# Patient Record
Sex: Female | Born: 1970 | Race: White | Hispanic: No | Marital: Married | Smoking: Never smoker
Health system: Southern US, Community
[De-identification: ages and names within clinical notes are randomized; demographics above are authoritative.]

## PROBLEM LIST (undated history)

## (undated) DIAGNOSIS — E119 Type 2 diabetes mellitus without complications: Secondary | ICD-10-CM

## (undated) DIAGNOSIS — R0981 Nasal congestion: Secondary | ICD-10-CM

## (undated) DIAGNOSIS — E039 Hypothyroidism, unspecified: Secondary | ICD-10-CM

## (undated) DIAGNOSIS — I10 Essential (primary) hypertension: Secondary | ICD-10-CM

## (undated) DIAGNOSIS — G4730 Sleep apnea, unspecified: Secondary | ICD-10-CM

## (undated) DIAGNOSIS — E079 Disorder of thyroid, unspecified: Secondary | ICD-10-CM

## (undated) DIAGNOSIS — T884XXA Failed or difficult intubation, initial encounter: Secondary | ICD-10-CM

## (undated) HISTORY — DX: Disorder of thyroid, unspecified: E07.9

## (undated) HISTORY — DX: Nasal congestion: R09.81

## (undated) HISTORY — DX: Essential (primary) hypertension: I10

---

## 2005-09-24 ENCOUNTER — Emergency Department: Payer: Self-pay | Admitting: Emergency Medicine

## 2005-10-17 HISTORY — PX: PARTIAL HYSTERECTOMY: SHX80

## 2005-10-17 HISTORY — PX: ABDOMINAL HYSTERECTOMY: SHX81

## 2005-10-17 HISTORY — PX: TUBAL LIGATION: SHX77

## 2005-11-11 ENCOUNTER — Ambulatory Visit: Payer: Self-pay

## 2005-11-19 ENCOUNTER — Observation Stay: Payer: Self-pay | Admitting: Unknown Physician Specialty

## 2006-01-31 ENCOUNTER — Ambulatory Visit: Payer: Self-pay | Admitting: Unknown Physician Specialty

## 2006-10-17 HISTORY — PX: MOUTH SURGERY: SHX715

## 2006-10-27 ENCOUNTER — Other Ambulatory Visit: Payer: Self-pay

## 2006-10-27 ENCOUNTER — Emergency Department: Payer: Self-pay | Admitting: Emergency Medicine

## 2008-01-30 ENCOUNTER — Other Ambulatory Visit: Payer: Self-pay

## 2008-01-30 ENCOUNTER — Emergency Department: Payer: Self-pay | Admitting: Emergency Medicine

## 2008-05-19 ENCOUNTER — Ambulatory Visit: Payer: Self-pay | Admitting: Unknown Physician Specialty

## 2008-05-20 ENCOUNTER — Ambulatory Visit: Payer: Self-pay | Admitting: Unknown Physician Specialty

## 2008-07-09 ENCOUNTER — Ambulatory Visit: Payer: Self-pay | Admitting: Unknown Physician Specialty

## 2008-07-15 ENCOUNTER — Ambulatory Visit: Payer: Self-pay | Admitting: Unknown Physician Specialty

## 2010-06-11 ENCOUNTER — Emergency Department: Payer: Self-pay | Admitting: Emergency Medicine

## 2011-12-11 IMAGING — CR DG RIBS 2V*L*
1 series · 4 of 4 positions shown · non-contrast
Comparison: none

REASON FOR EXAM: pain after fall, and syncope
COMMENTS:   May transport without cardiac monitor

PROCEDURE:     DXR - DXR RIBS LEFT UNILATERAL  - June 11, 2010  [DATE]
RESULT:     Comparison: None

[Series 1: view not recorded · 0.17mm/px · 4 of 4 slices shown]
[im 1/4]
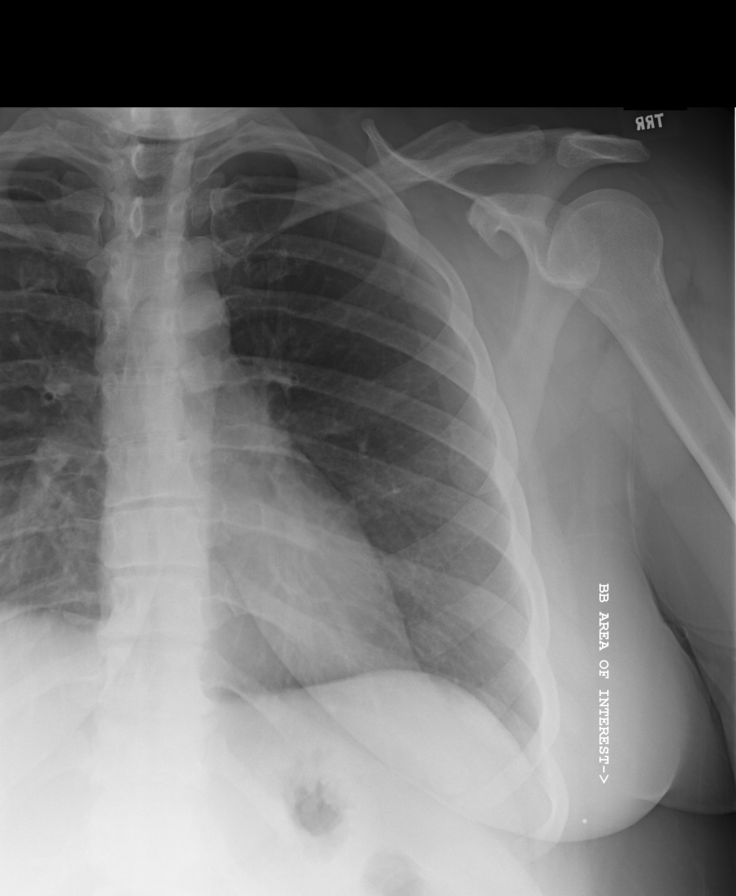
[im 2/4]
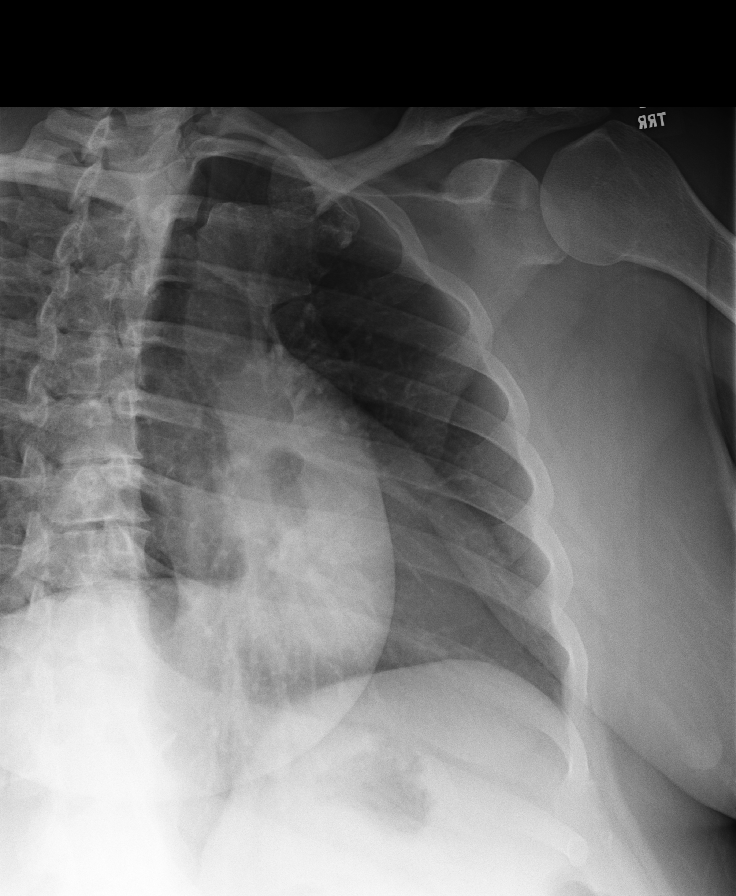
[im 3/4]
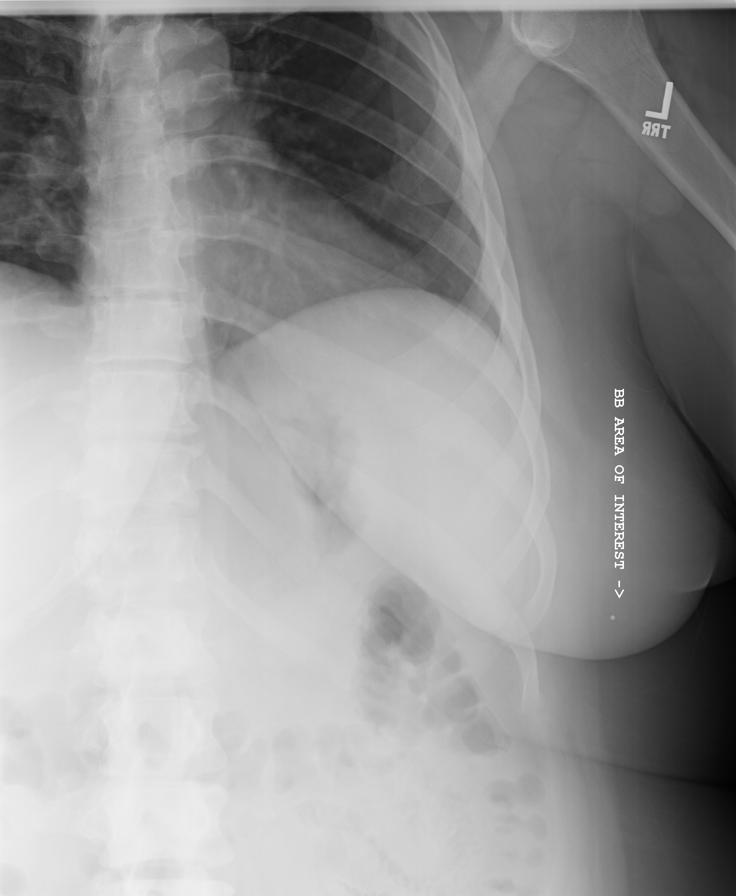
[im 4/4]
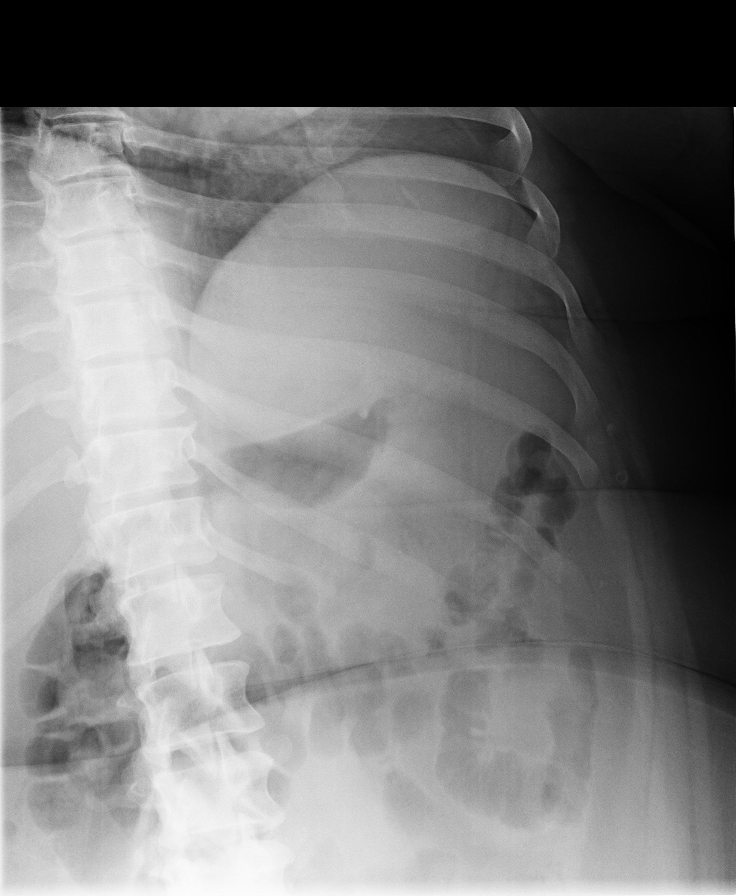

[4 of 4 positions shown; findings below may reference images not displayed]

FINDINGS: 4 views of the left ribs demonstrates no rib fracture or rib
deformity. There is no pleural reaction.
IMPRESSION: No acute osseous injury of the left ribs.

## 2011-12-11 IMAGING — CR DG KNEE COMPLETE 4+V*R*
1 series · 4 of 4 positions shown · non-contrast
Comparison: none

REASON FOR EXAM: pain
COMMENTS:   May transport without cardiac monitor

PROCEDURE:     DXR - DXR KNEE RT COMP WITH OBLIQUES  - June 11, 2010  [DATE]
RESULT:     Comparison:  None

[Series 1: view not recorded · 0.17mm/px · 4 of 4 slices shown]
[im 1/4]
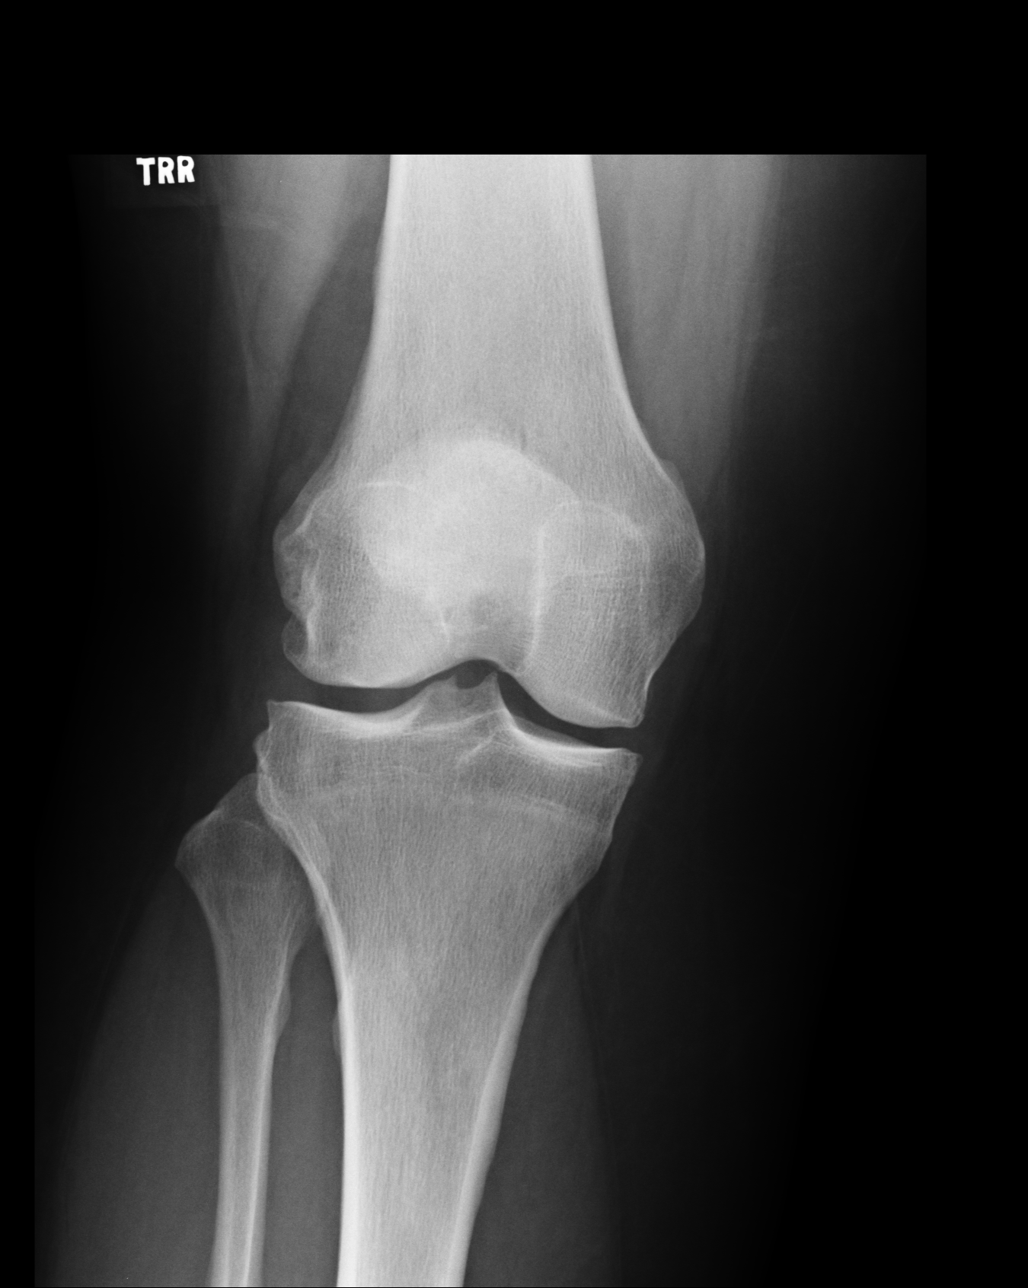
[im 2/4]
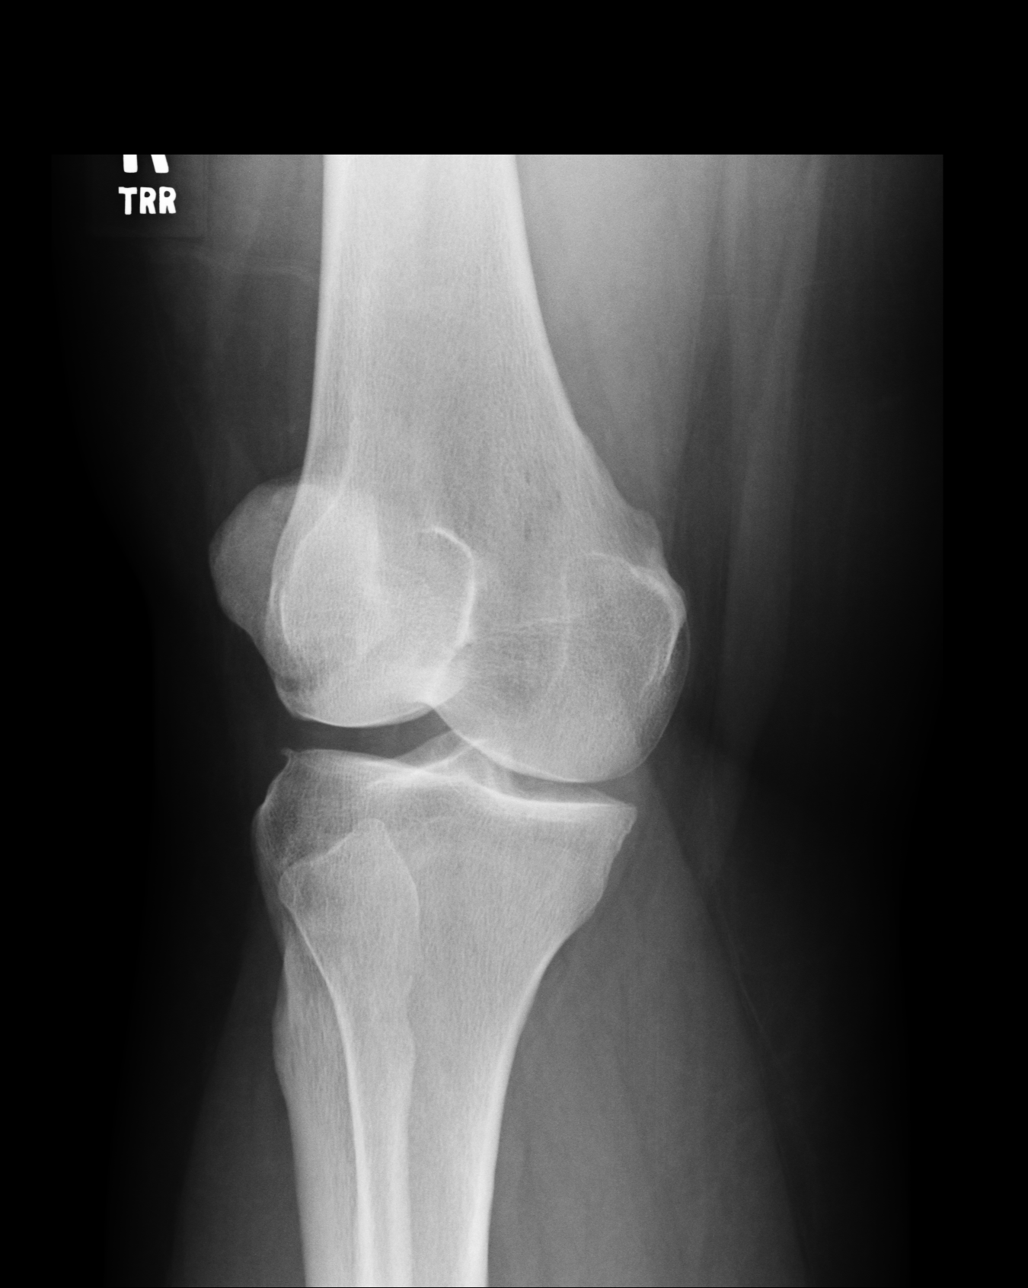
[im 3/4]
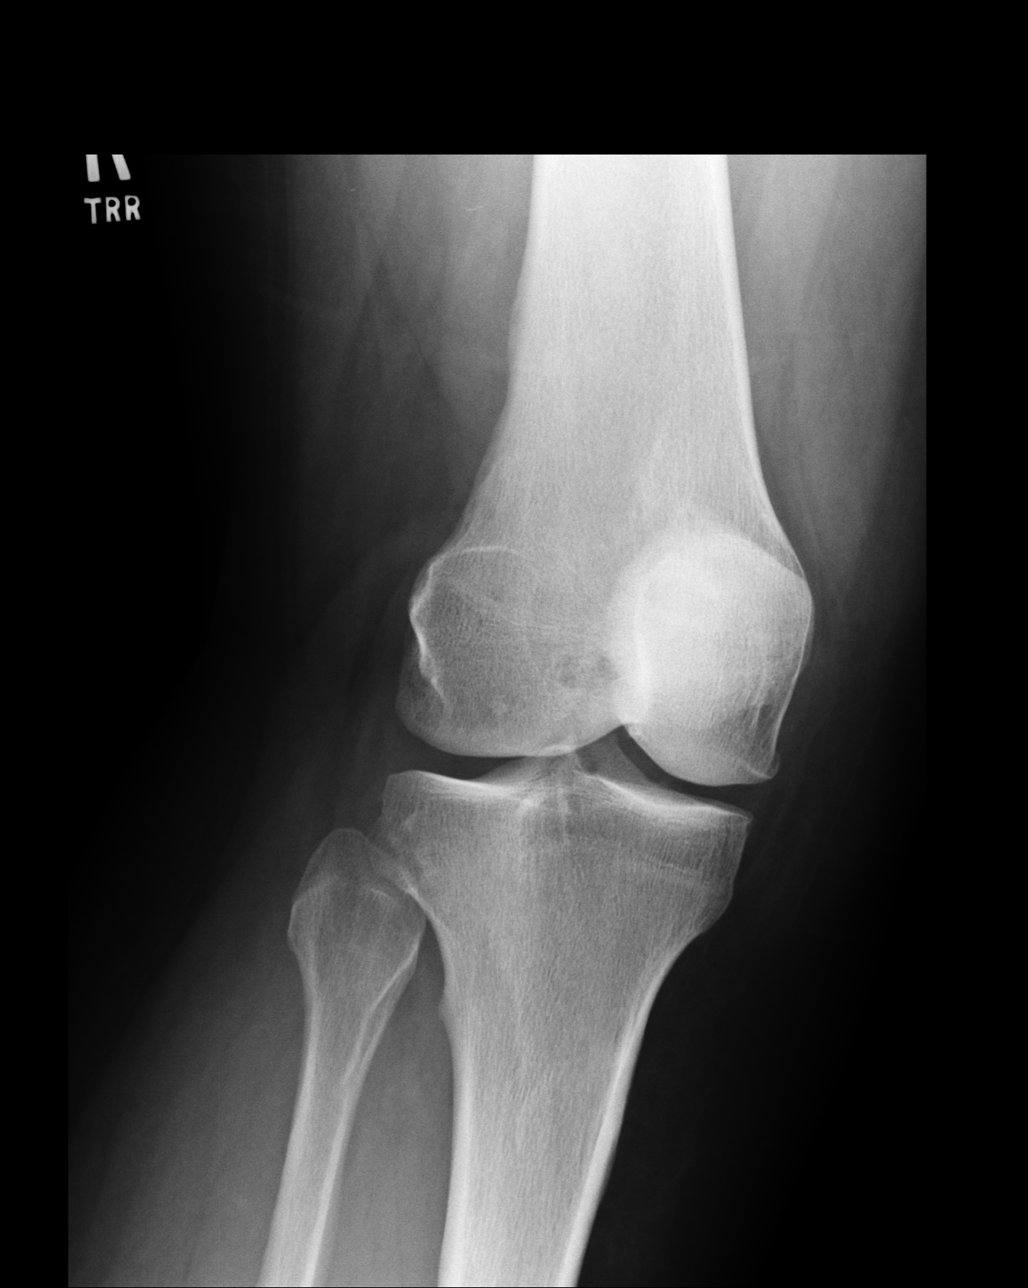
[im 4/4]
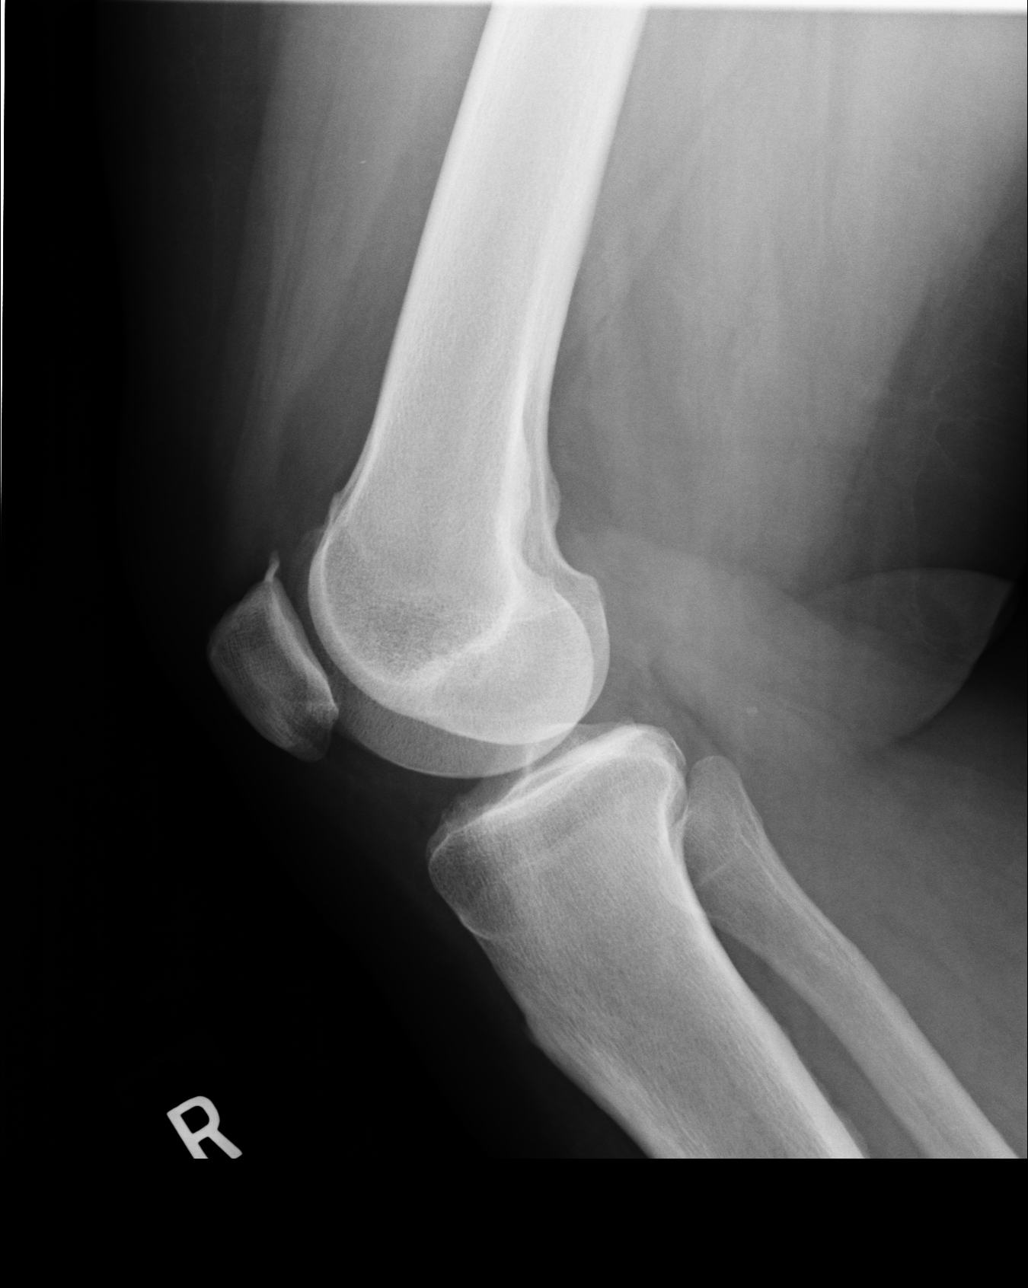

[4 of 4 positions shown; findings below may reference images not displayed]

FINDINGS: 4 views of the right knee demonstrates no acute fracture or dislocation.
There is no significant joint effusion. There is a patellofemoral marginal
osteophyte. The there is a lateral tibiofemoral compartment marginal
osteophyte.
IMPRESSION: No acute osseous injury of the right knee.

## 2011-12-11 IMAGING — CT CT HEAD WITHOUT CONTRAST
2 series · 16 of 30 positions shown, 20 images · non-contrast
Comparison: none

REASON FOR EXAM: hematoma to left forehead/eye, and syncope
COMMENTS:   May transport without cardiac monitor

PROCEDURE:     CT  - CT HEAD WITHOUT CONTRAST  - June 11, 2010  [DATE]
RESULT:
TECHNIQUE: Noncontrast emergent CT of the brain is performed in the standard
fashion.
There is no previous examination for comparison.

[Series 2: without · axial · non-contrast · 0.40mm/px · z∈[+150,+280]mm · 13 of 32 slices shown, 17 images]
[im 3/32  brain]
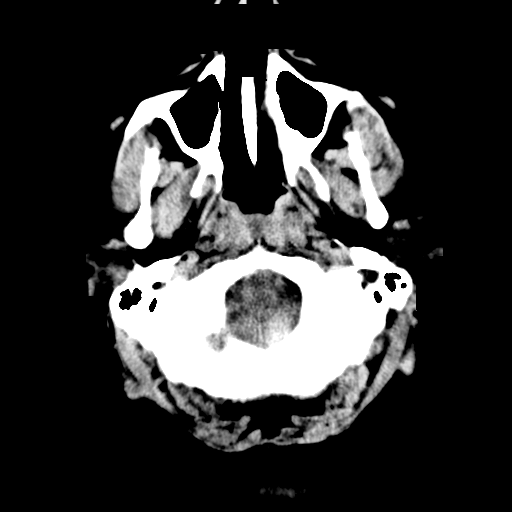
[im 3/32  bone]
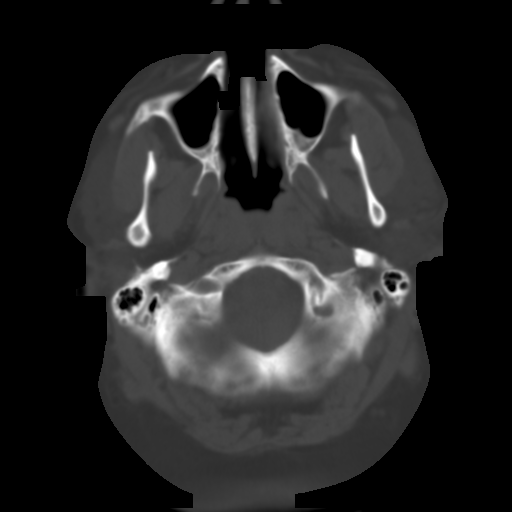
[im 5/32  brain]
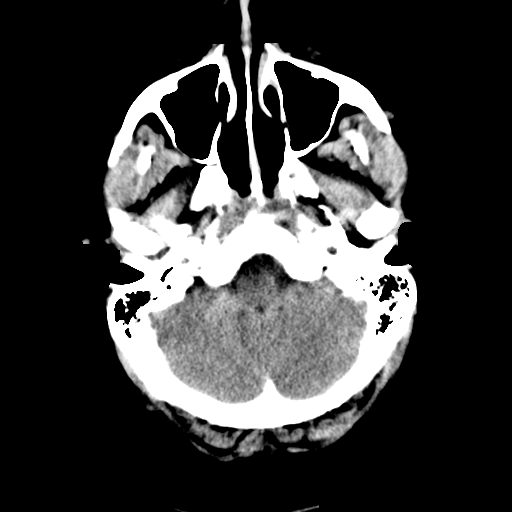
[im 7/32  brain]
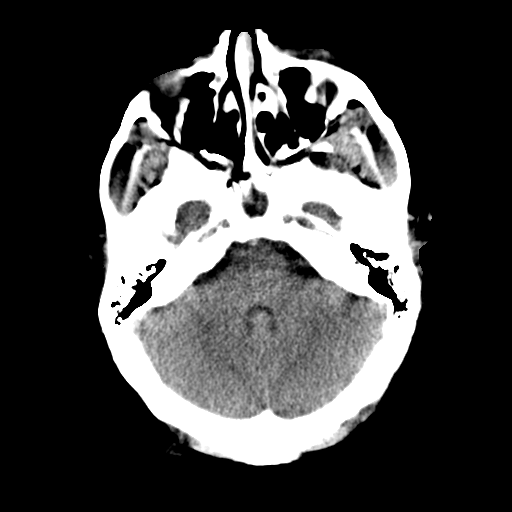
[im 9/32  brain]
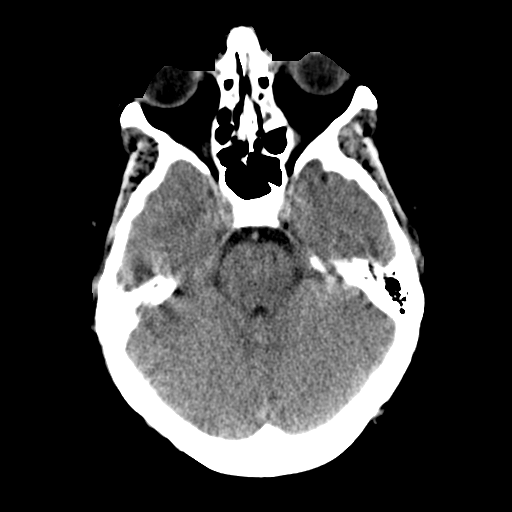
[im 12/32  brain]
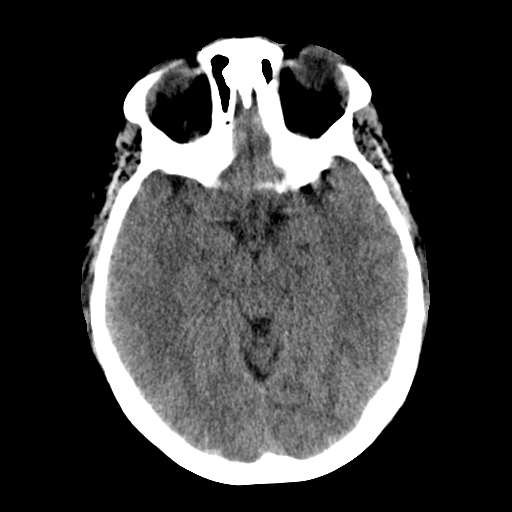
[im 12/32  bone]
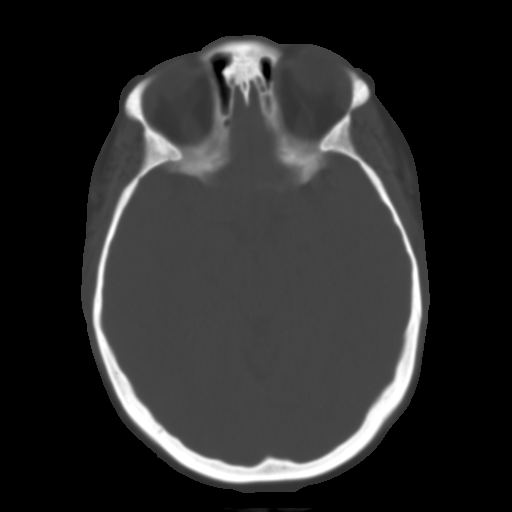
[im 14/32  brain]
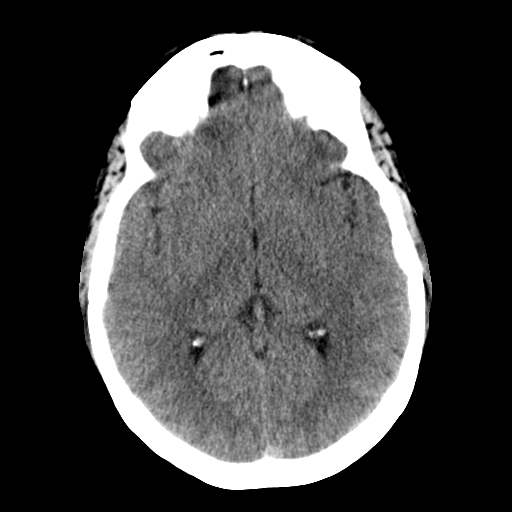
[im 16/32  brain]
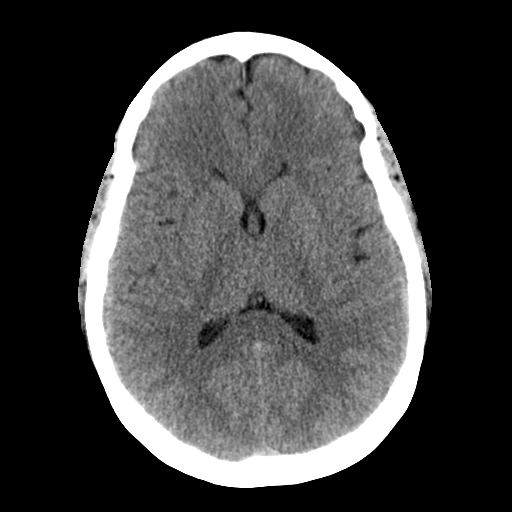
[im 18/32  brain]
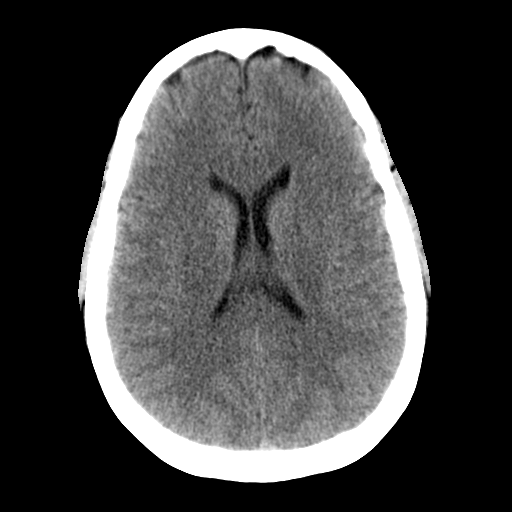
[im 20/32  brain]
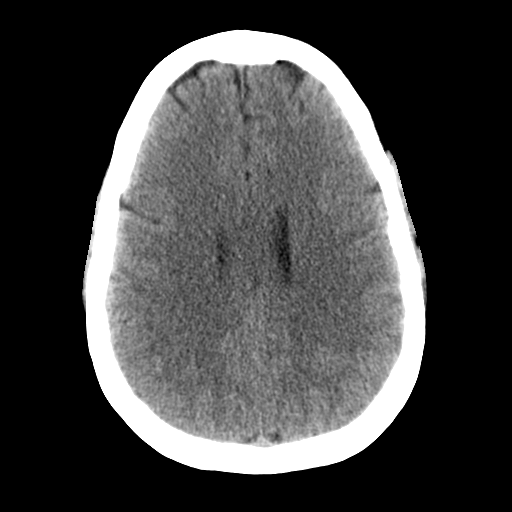
[im 20/32  bone]
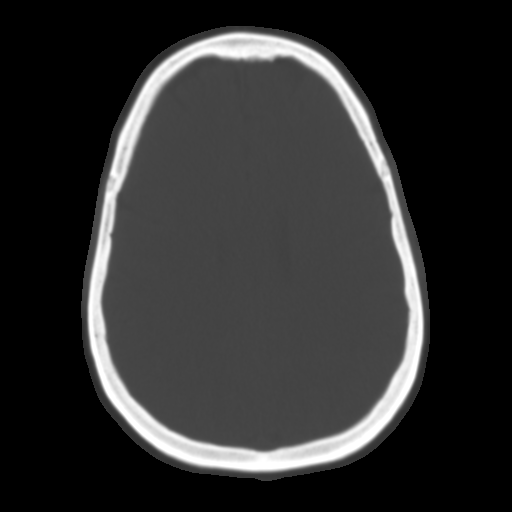
[im 23/32  brain]
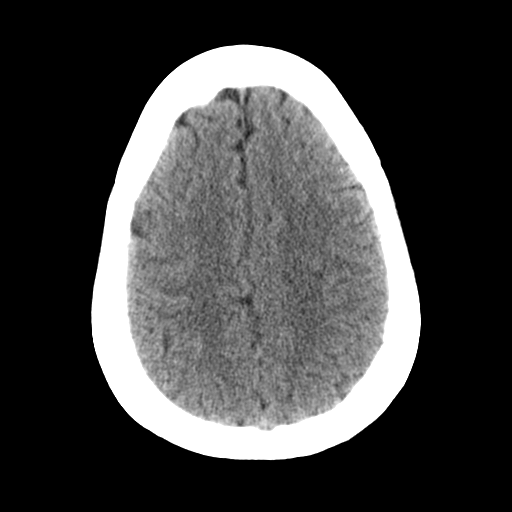
[im 25/32  brain]
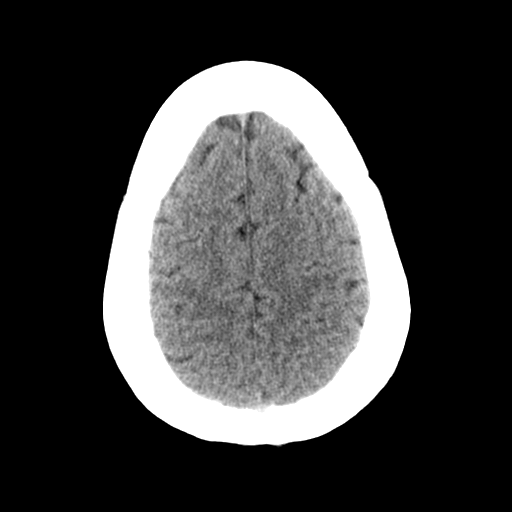
[im 27/32  brain]
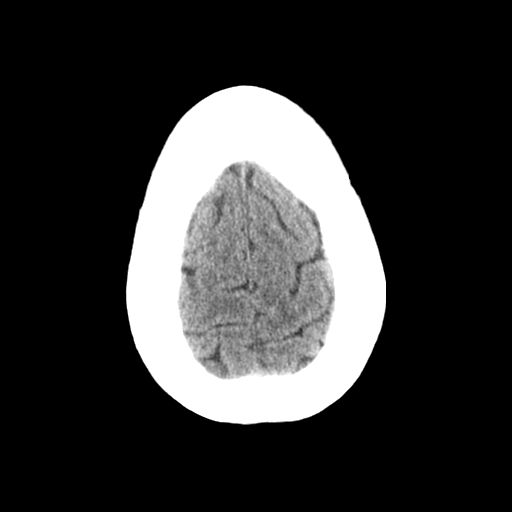
[im 29/32  brain]
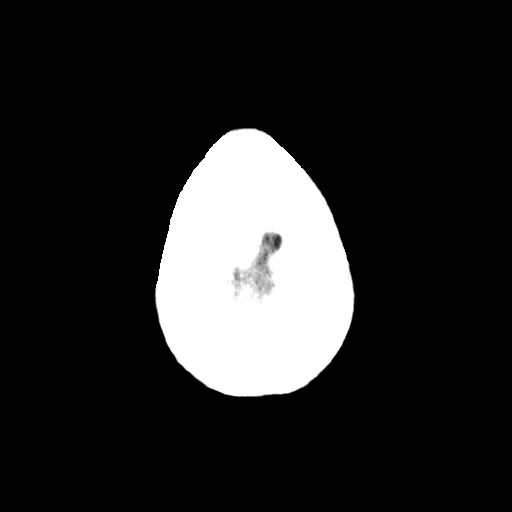
[im 29/32  bone]
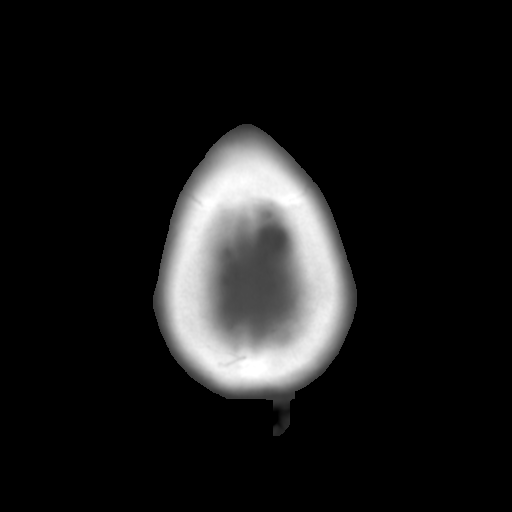

[Series 3: bone · axial · 0.40mm/px · z∈[+150,+196]mm · 3 of 32 slices shown]
[im 3/32  bone]
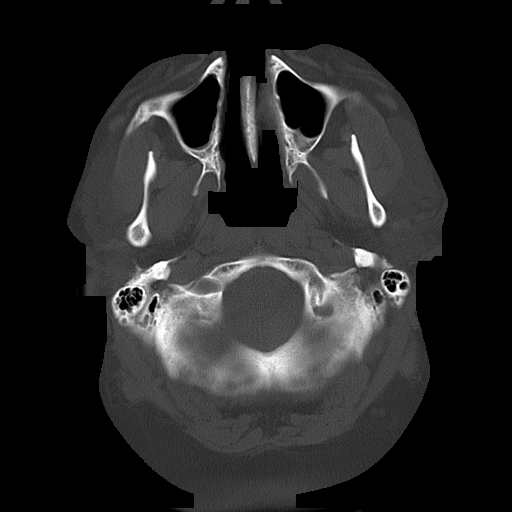
[im 7/32  bone]
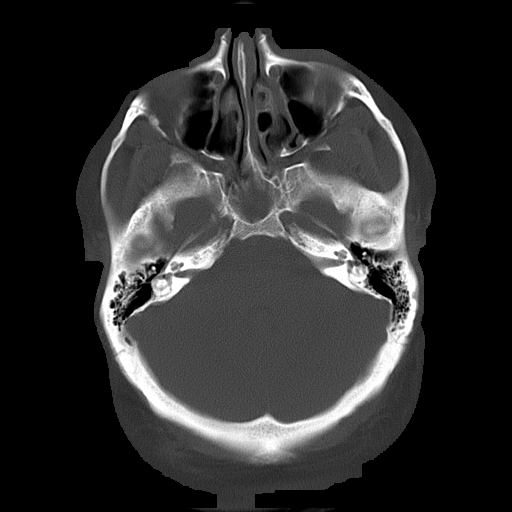
[im 12/32  bone]
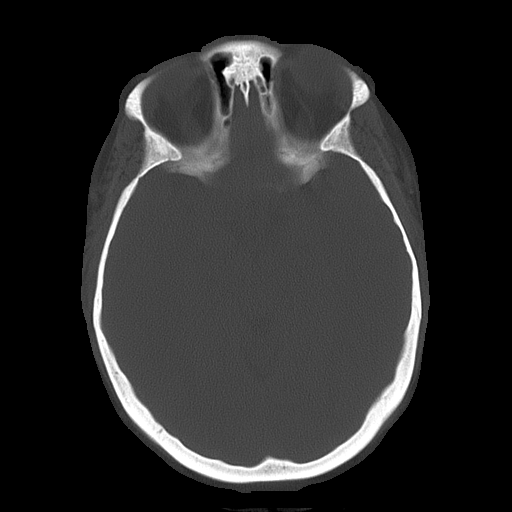

[16 of 30 positions shown; findings below may reference images not displayed]

FINDINGS: There is near complete opacification of the inferior portion of
the ethmoid sinuses. An air/fluid level is present within the sphenoid
sinus. The mastoids appear clear. The included maxillary sinuses and frontal
sinuses show no opacification. The calvarium is intact. The ventricles and
sulci appear to be within normal limits. There is no intracranial
hemorrhage, mass effect or midline shift. There is no territorial infarct.
IMPRESSION: 1.     Findings suggestive of ethmoid and sphenoid sinusitis.
2.     No acute intracranial abnormality evident.

## 2011-12-11 IMAGING — CR DG KNEE COMPLETE 4+V*L*
1 series · 4 of 4 positions shown · non-contrast
Comparison: none

REASON FOR EXAM: pain
COMMENTS:   May transport without cardiac monitor

PROCEDURE:     DXR - DXR KNEE LT COMP WITH OBLIQUES  - June 11, 2010  [DATE]
RESULT:     Comparison:  None

[Series 1: view not recorded · 0.17mm/px · 4 of 4 slices shown]
[im 1/4]
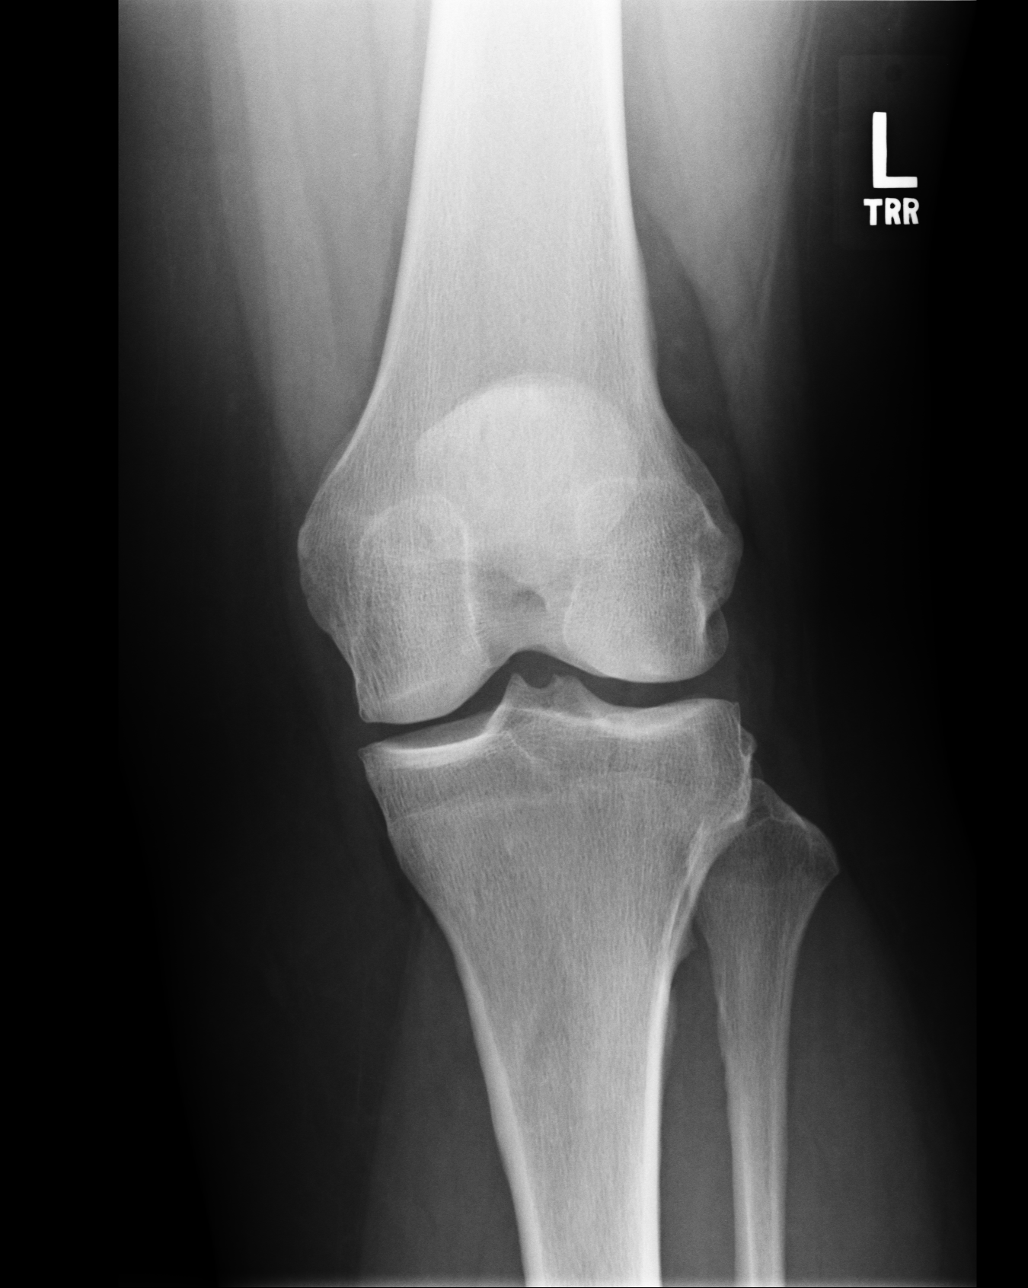
[im 2/4]
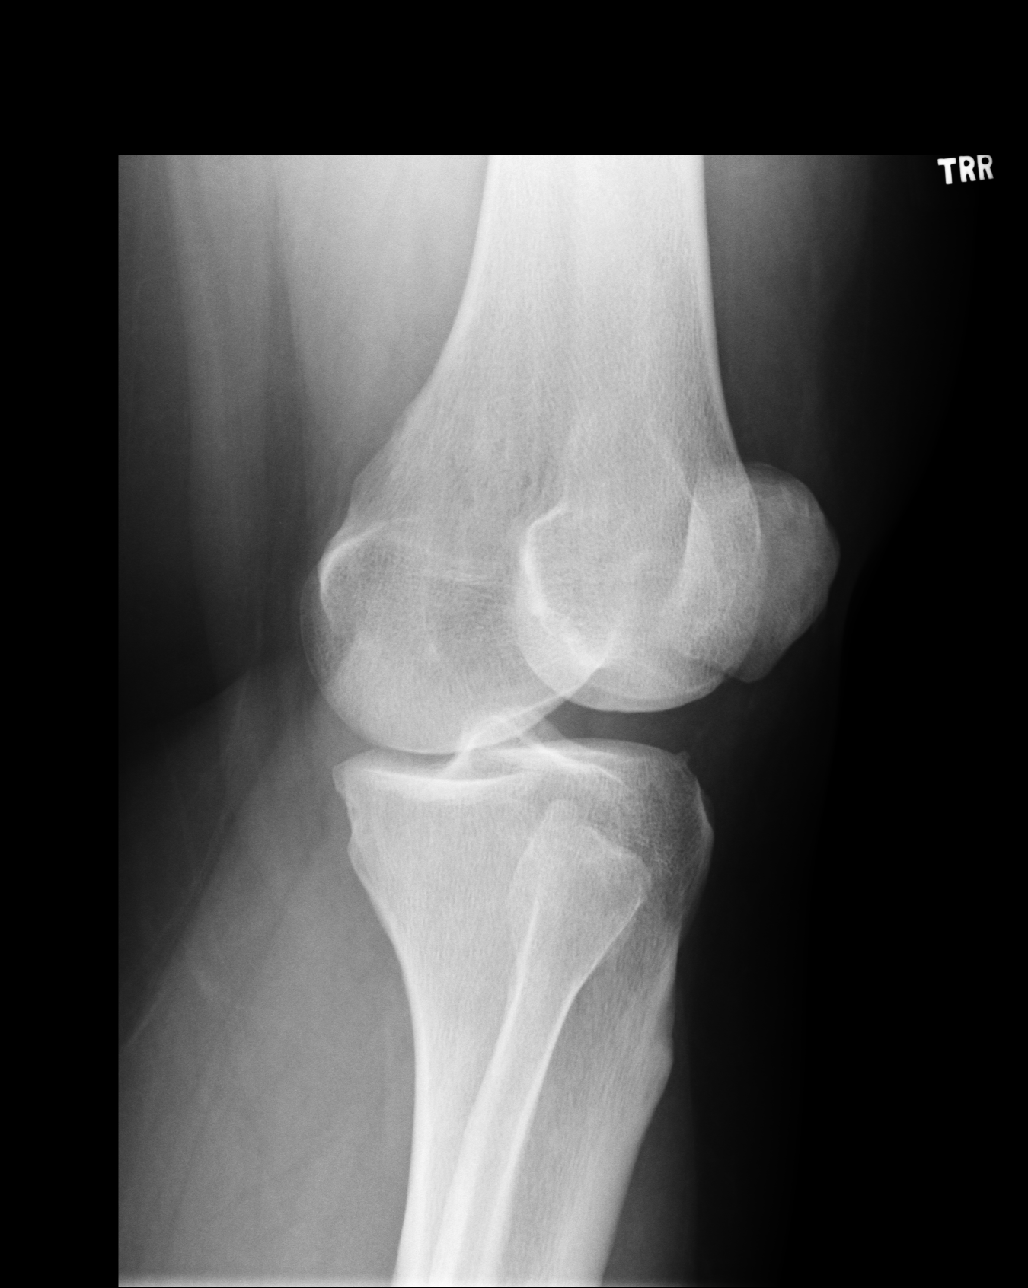
[im 3/4]
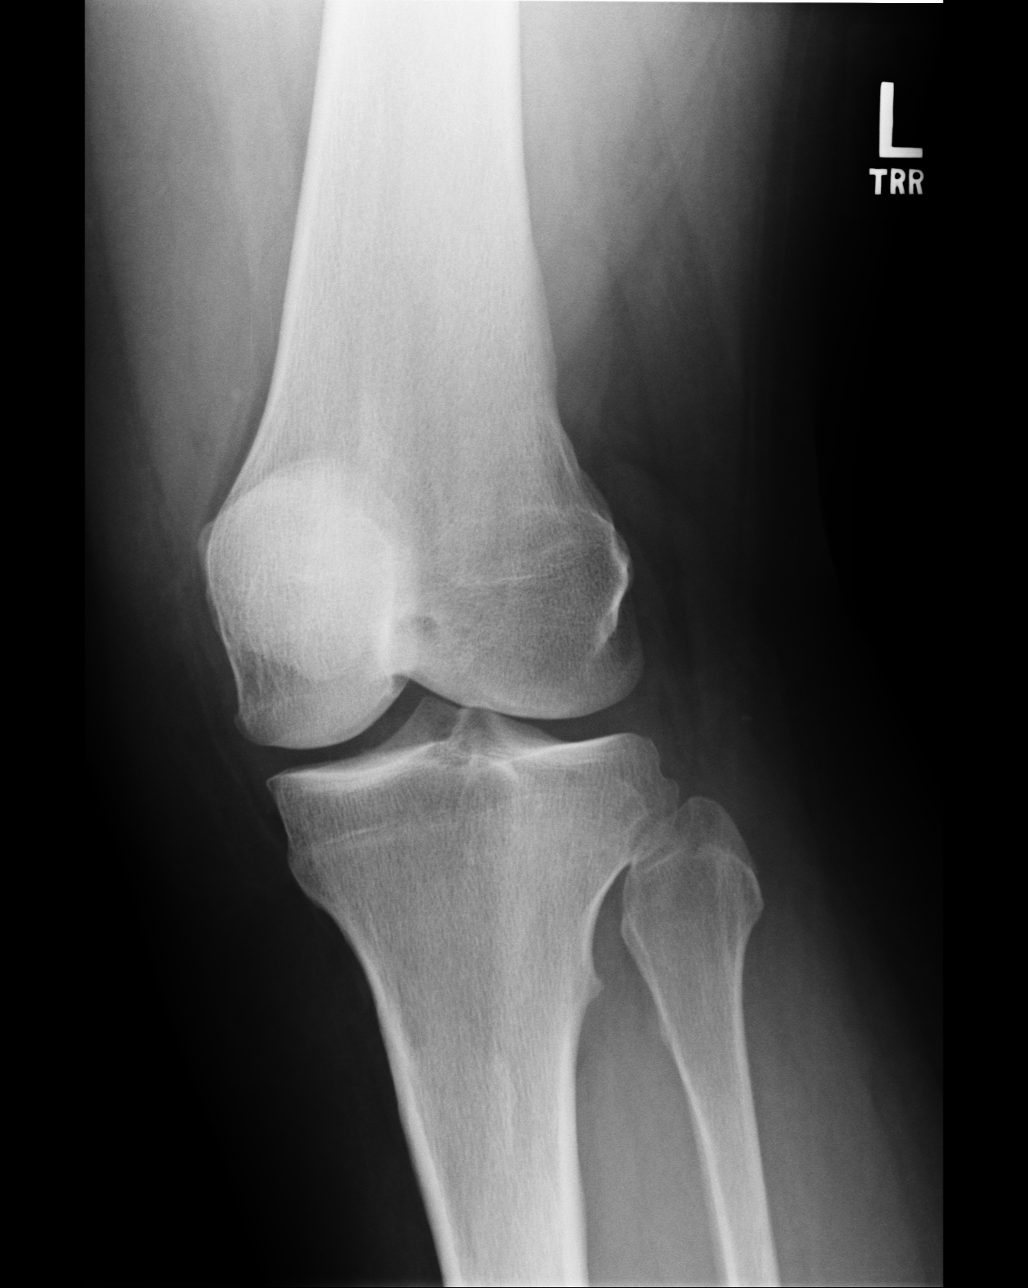
[im 4/4]
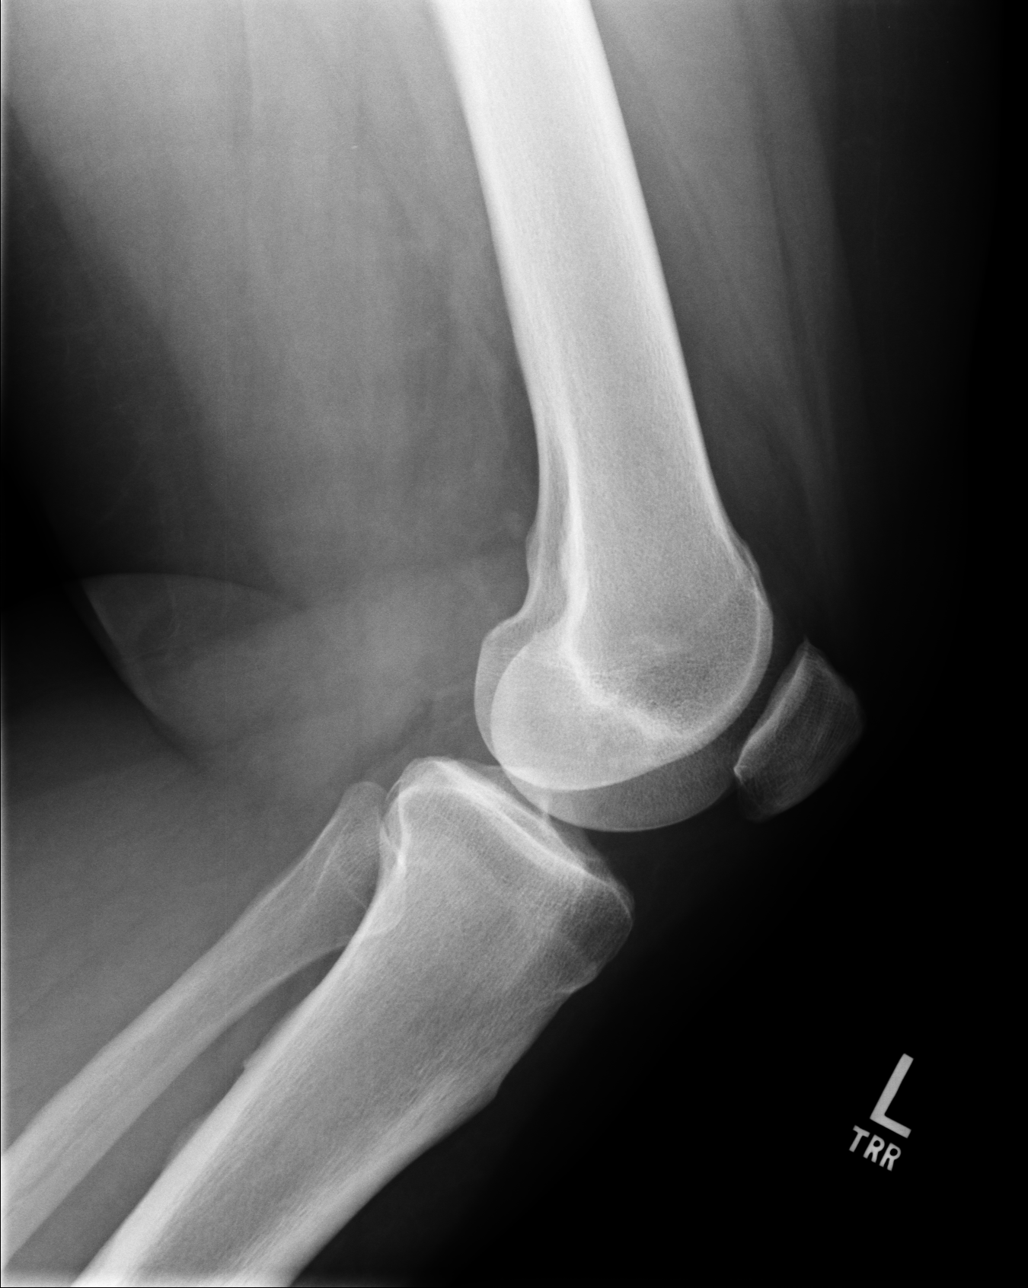

[4 of 4 positions shown; findings below may reference images not displayed]

FINDINGS: 4 views of the left knee demonstrates no acute fracture or dislocation.
There is no significant joint effusion.
IMPRESSION: No acute osseous injury of the left knee.

## 2011-12-11 IMAGING — CR CERVICAL SPINE - COMPLETE 4+ VIEW
1 series · 7 of 7 positions shown · non-contrast
Comparison: None

REASON FOR EXAM: fall
COMMENTS:   May transport without cardiac monitor

PROCEDURE:     DXR - DXR CERVICAL SPINE COMPLETE  - June 11, 2010  [DATE]
RESULT:     History: Fall

[Series 1: view not recorded · 0.17mm/px · 7 of 7 slices shown]
[im 1/7]
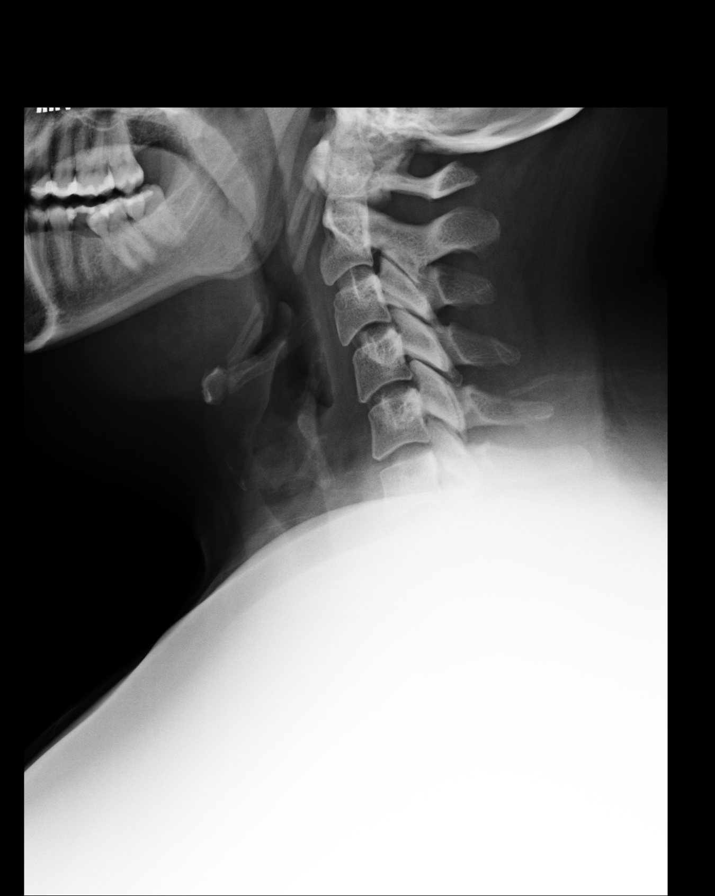
[im 2/7]
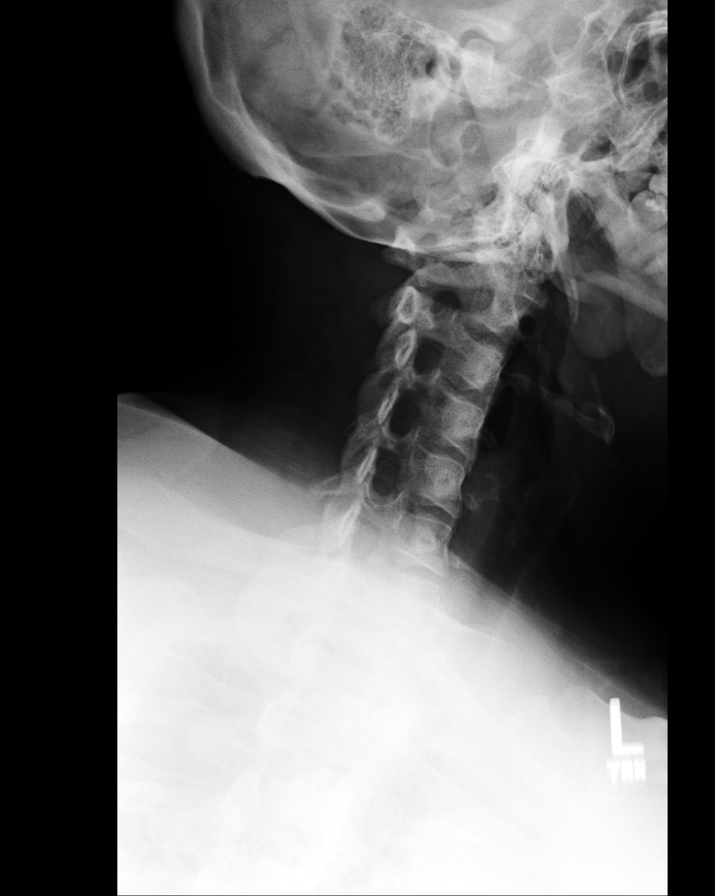
[im 3/7]
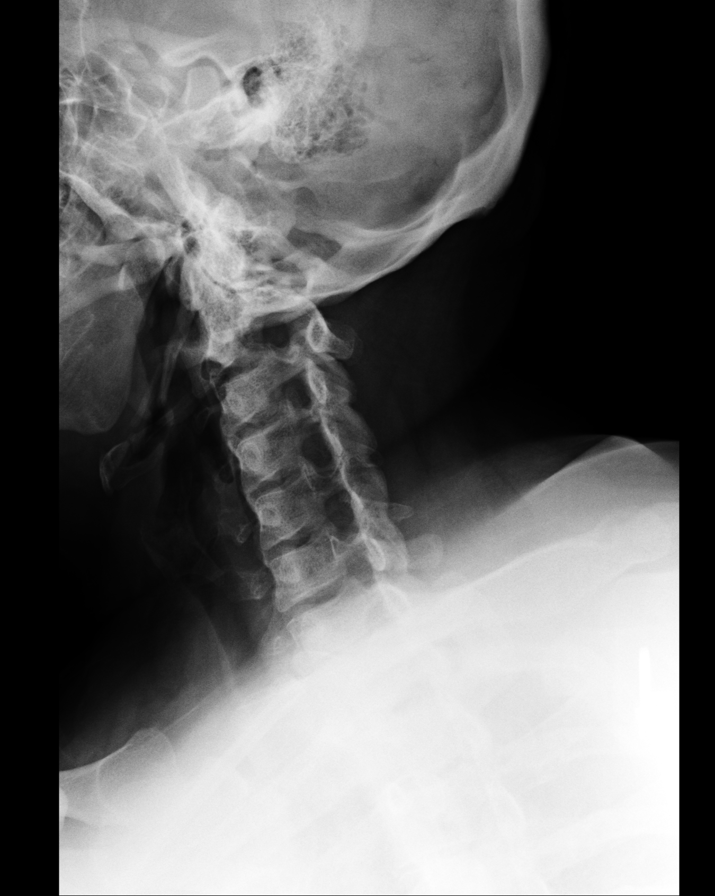
[im 4/7]
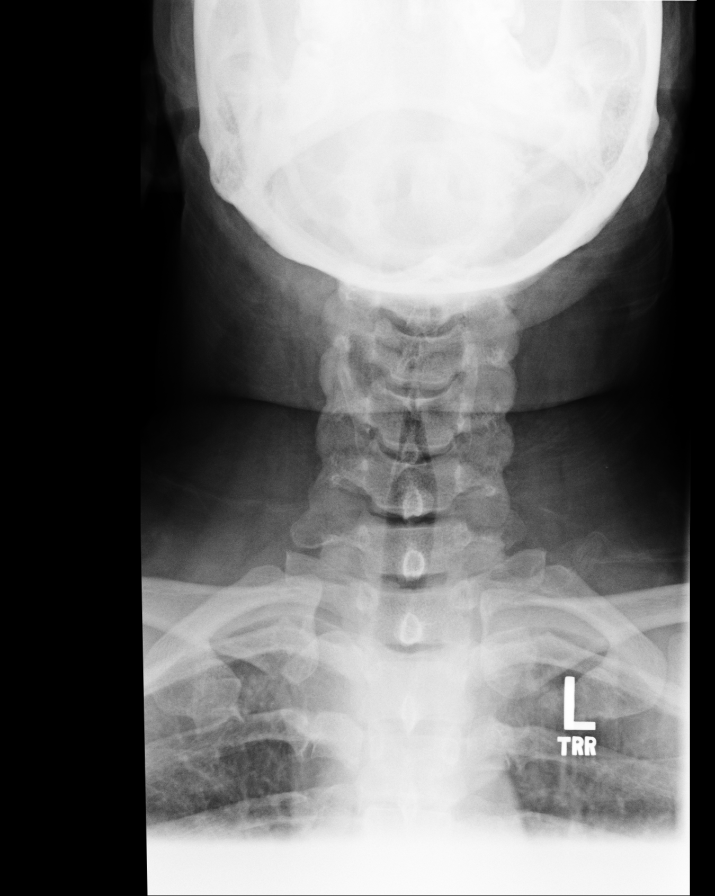
[im 5/7]
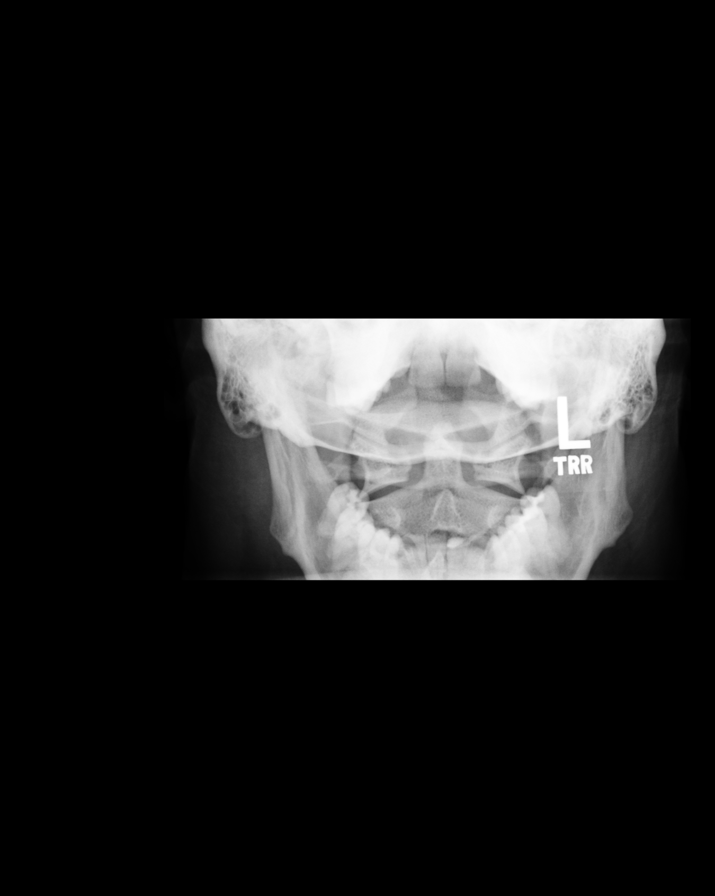
[im 6/7]
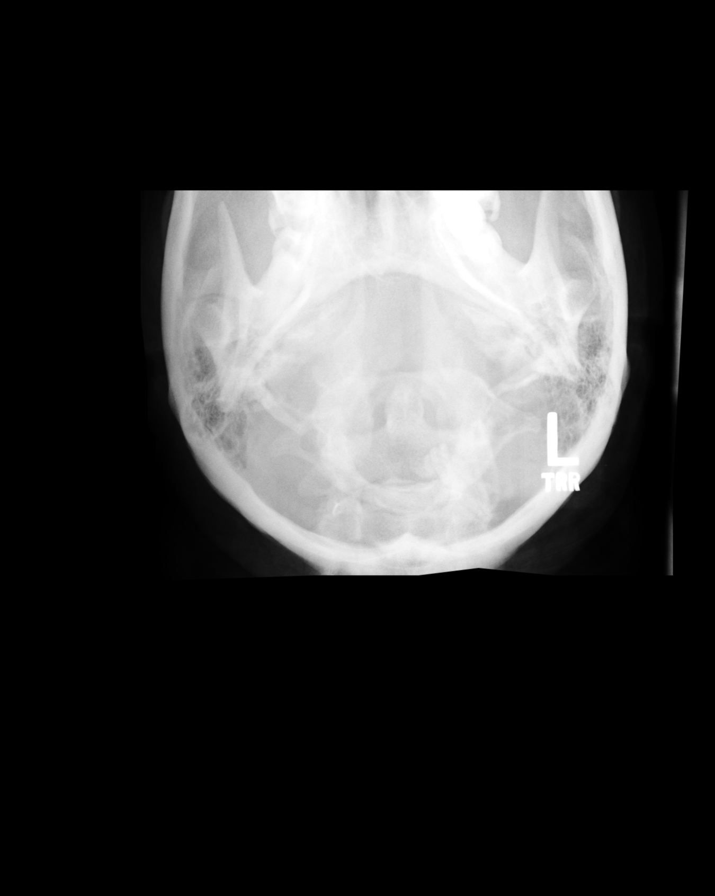
[im 7/7]
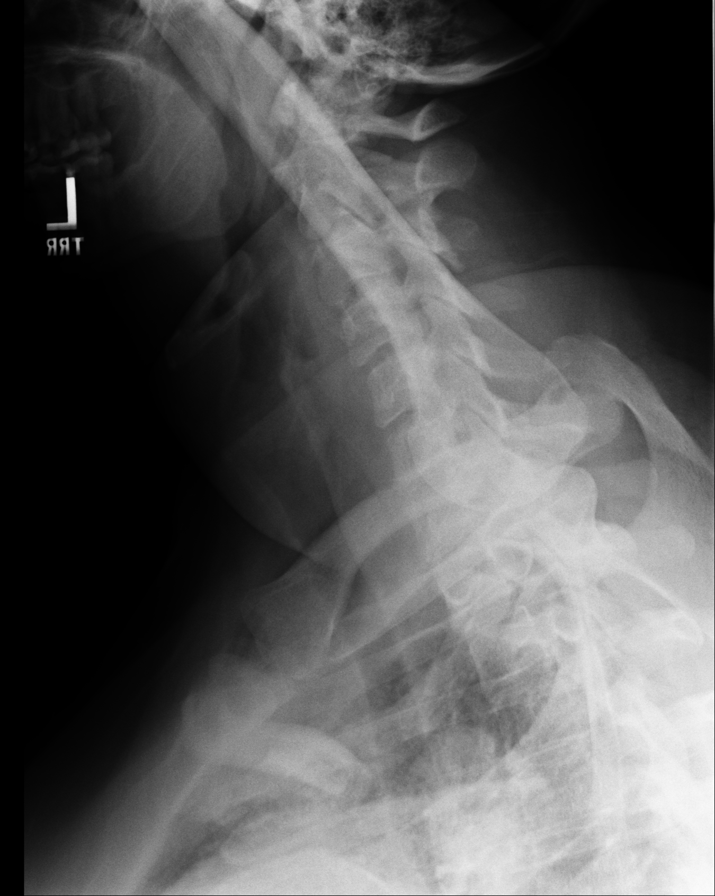

[7 of 7 positions shown; findings below may reference images not displayed]

FINDINGS: AP, lateral, swimmer's, odontoid and bilateral oblique views of the cervical
spine are provided.

The cervical spine is visualized to the level of C6. The C7 vertebral body
and the cervicothoracic junction are not visualized limiting evaluation for
injury. The cervical spine cannot be cleared based upon incomplete
visualization at these levels. If there is further clinical concern
regarding trauma recommend further evaluation with a CT of the cervical
spine.

The vertebral body heights are maintained. The alignment is normal. The
prevertebral soft tissues are normal. There is no acute fracture or static
listhesis. Bilateral neural foramina are patent.
IMPRESSION: The C7 vertebral body and the cervicothoracic junction are not visualized
limiting evaluation for injury. The cervical spine cannot be cleared based
upon incomplete visualization at these levels. If there is further clinical
concern regarding trauma recommend further evaluation with a CT of the
cervical spine.

No acute osseous injury of the visualized cervical spine.

## 2012-04-29 IMAGING — CR DG ANKLE 2V *L*
3 series · 3 of 3 positions shown · non-contrast
Comparison: none

[view not recorded (1 of 3)]
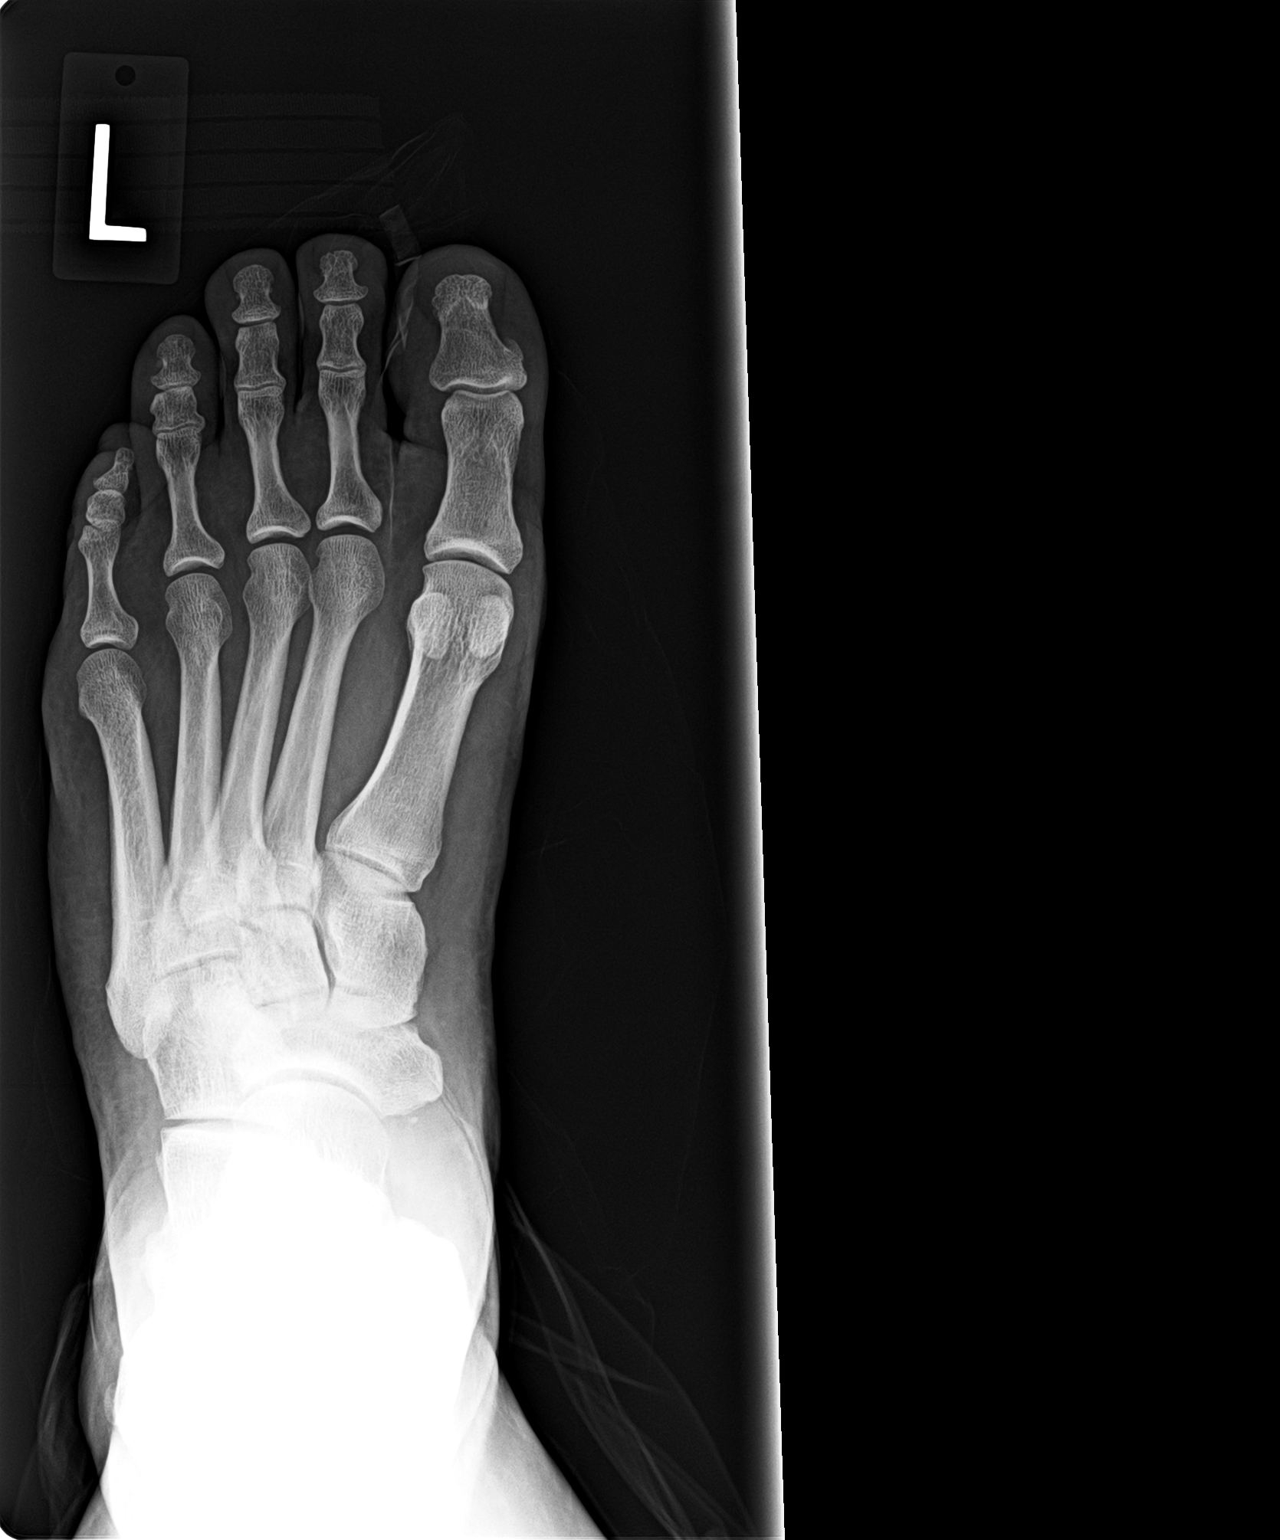

[view not recorded (2 of 3)]
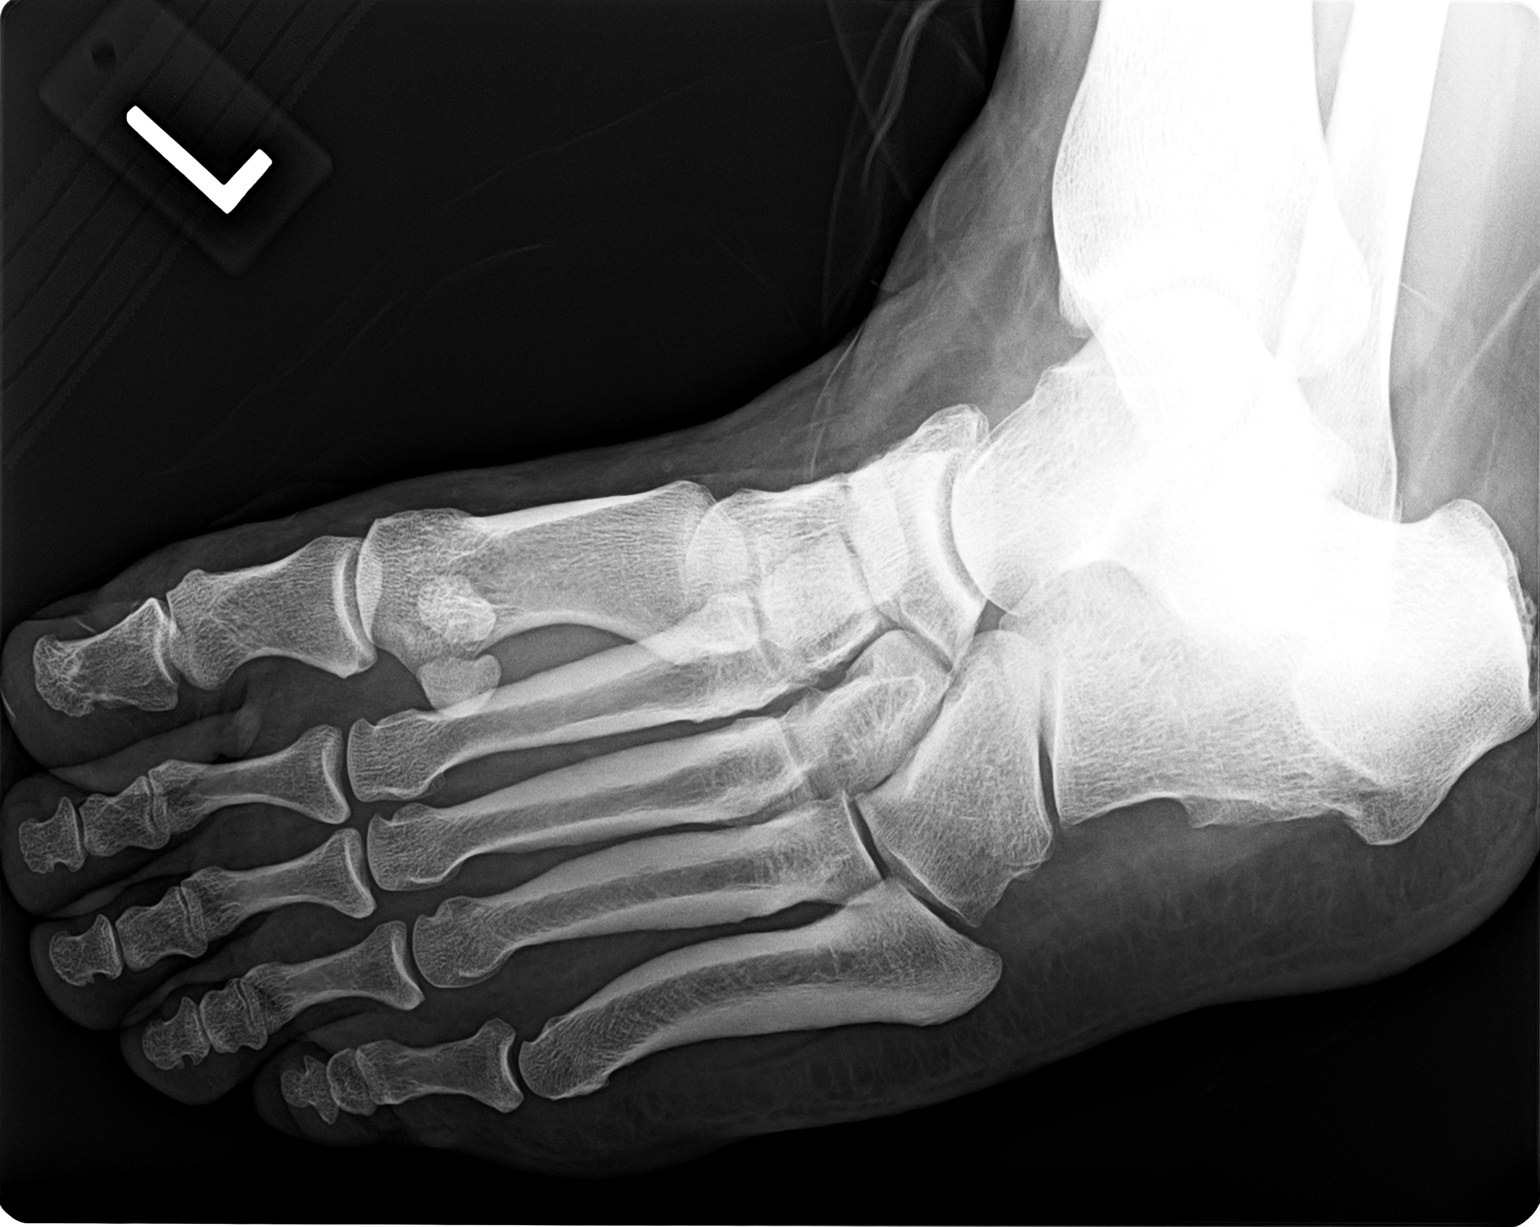

[view not recorded (3 of 3)]
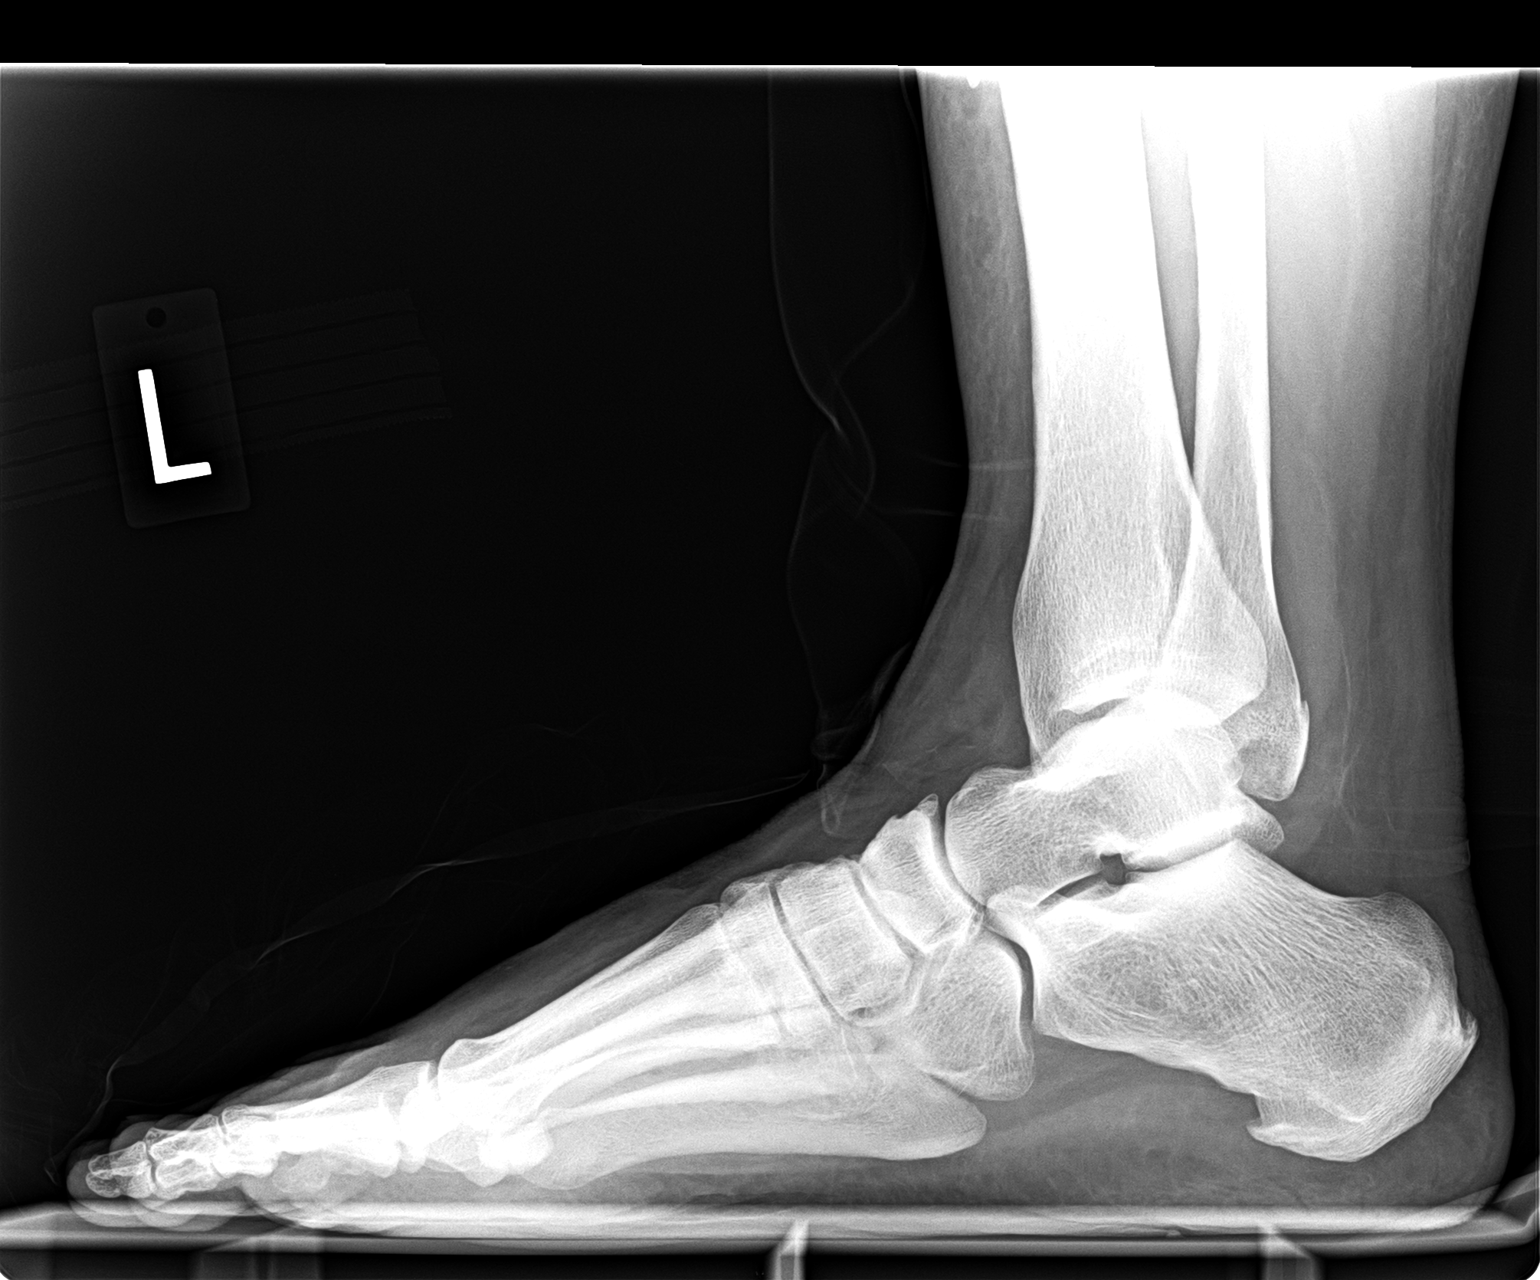

[3 of 3 positions shown; findings below may reference images not displayed]

Canned report from images found in remote index.

Refer to host system for actual result text.

## 2013-07-24 ENCOUNTER — Encounter: Payer: Self-pay | Admitting: *Deleted

## 2013-07-26 ENCOUNTER — Ambulatory Visit: Payer: Self-pay | Admitting: Podiatry

## 2013-07-26 ENCOUNTER — Ambulatory Visit (INDEPENDENT_AMBULATORY_CARE_PROVIDER_SITE_OTHER): Payer: BC Managed Care – PPO | Admitting: Podiatry

## 2013-07-26 ENCOUNTER — Encounter: Payer: Self-pay | Admitting: Podiatry

## 2013-07-26 VITALS — BP 100/43 | HR 73 | Resp 16 | Ht 70.0 in | Wt 320.0 lb

## 2013-07-26 DIAGNOSIS — B351 Tinea unguium: Secondary | ICD-10-CM

## 2013-07-26 DIAGNOSIS — M722 Plantar fascial fibromatosis: Secondary | ICD-10-CM

## 2013-07-26 DIAGNOSIS — L259 Unspecified contact dermatitis, unspecified cause: Secondary | ICD-10-CM

## 2013-07-26 MED ORDER — TRIAMCINOLONE ACETONIDE 10 MG/ML IJ SUSP
5.0000 mg | Freq: Once | INTRAMUSCULAR | Status: AC
  Administered 2013-07-26: 5 mg via INTRA_ARTICULAR

## 2013-07-27 NOTE — Progress Notes (Signed)
Subjective:     Patient ID: Karen Wiley, female   DOB: 1971-04-23, 42 y.o.   MRN: [id number]  HPI patient states that her heel is still really hurting right. Also states her orthotics are starting to wear out and that she cannot wear them in some of the shoe she needs to wear at work.   Review of Systems  All other systems reviewed and are negative.       Objective:   Physical Exam  Cardiovascular: Intact distal pulses.   Musculoskeletal: Normal range of motion.  Neurological: She is alert.  Skin: Skin is warm.   patient's right heel remains very tender at the medial calcaneal tubercle. Obesity is definitely a contributing factor to condition.    Assessment:     Plantar fasciitis right heel continuing nature    Plan:     Reinjected the plantar fascia 3 mg Kenalog 5 mg Xylocaine Marcaine mixture. advised on continued ice pack therapy and night splint. Scanned for custom orthotic devices or other types of shoes. Commended replacing the top covers on existing orthotics when those are returned

## 2013-08-16 ENCOUNTER — Ambulatory Visit: Payer: BC Managed Care – PPO | Admitting: Podiatry

## 2013-08-30 ENCOUNTER — Ambulatory Visit (INDEPENDENT_AMBULATORY_CARE_PROVIDER_SITE_OTHER): Payer: BC Managed Care – PPO | Admitting: Podiatry

## 2013-08-30 ENCOUNTER — Encounter: Payer: Self-pay | Admitting: Podiatry

## 2013-08-30 VITALS — BP 100/43 | HR 73 | Resp 16 | Ht 70.0 in | Wt 324.0 lb

## 2013-08-30 DIAGNOSIS — M722 Plantar fascial fibromatosis: Secondary | ICD-10-CM

## 2013-08-30 NOTE — Progress Notes (Signed)
Subjective:     Patient ID: Karen Wiley, female   DOB: 29-Dec-1970, 42 y.o.   MRN: [id number]  HPI patient presents stating my heels are feeling much better and I believe the splints are helping me. I also need my old orthotics fixed   Review of Systems     Objective:   Physical Exam  Constitutional: She is oriented to person, place, and time. She appears well-nourished.  Cardiovascular: Intact distal pulses.   Musculoskeletal: Normal range of motion.  Neurological: She is oriented to person, place, and time.  Skin: Skin is warm.   patient's heels are much better when pressed with mild discomfort when I pressed deep into the heels themselves     Assessment:     Plantar fasciitis improved he'll both feet    Plan:     Advised on new orthotics and discussed continued night splint usage and weighs to try to control inflammation. Encouraged weight loss

## 2013-08-30 NOTE — Progress Notes (Signed)
  Subjective:    Patient ID: Karen Wiley, female    DOB: 12/23/1970, 42 y.o.   MRN: [id number]  HPI Comments: The heels are hurting , picking up orthotics and dropping the other pair off to be refurbished      Review of Systems  Endocrine: Positive for cold intolerance and heat intolerance.  All other systems reviewed and are negative.       Objective:   Physical Exam        Assessment & Plan:

## 2013-09-27 ENCOUNTER — Encounter: Payer: Self-pay | Admitting: Podiatry

## 2015-03-06 ENCOUNTER — Telehealth: Payer: Self-pay | Admitting: Family Medicine

## 2016-02-14 ENCOUNTER — Emergency Department
Admission: EM | Admit: 2016-02-14 | Discharge: 2016-02-14 | Disposition: A | Discharge status: Home / Self Care | Payer: BLUE CROSS/BLUE SHIELD | Attending: Emergency Medicine | Admitting: Emergency Medicine

## 2016-02-14 ENCOUNTER — Emergency Department: Payer: BLUE CROSS/BLUE SHIELD

## 2016-02-14 DIAGNOSIS — J209 Acute bronchitis, unspecified: Secondary | ICD-10-CM | POA: Diagnosis not present

## 2016-02-14 DIAGNOSIS — R05 Cough: Secondary | ICD-10-CM | POA: Diagnosis present

## 2016-02-14 DIAGNOSIS — R55 Syncope and collapse: Secondary | ICD-10-CM | POA: Insufficient documentation

## 2016-02-14 DIAGNOSIS — I10 Essential (primary) hypertension: Secondary | ICD-10-CM | POA: Insufficient documentation

## 2016-02-14 DIAGNOSIS — Z79899 Other long term (current) drug therapy: Secondary | ICD-10-CM | POA: Insufficient documentation

## 2016-02-14 DIAGNOSIS — R058 Other specified cough: Secondary | ICD-10-CM

## 2016-02-14 LAB — CBC
HEMATOCRIT: 39.9 % (ref 35.0–47.0)
HEMOGLOBIN: 13.3 g/dL (ref 12.0–16.0)
MCH: 28.2 pg (ref 26.0–34.0)
MCHC: 33.2 g/dL (ref 32.0–36.0)
MCV: 84.9 fL (ref 80.0–100.0)
PLATELETS: 306 10*3/uL (ref 150–440)
RBC: 4.7 MIL/uL (ref 3.80–5.20)
RDW: 14.7 % — ABNORMAL HIGH (ref 11.5–14.5)
WBC: 17.2 10*3/uL — AB (ref 3.6–11.0)

## 2016-02-14 LAB — BASIC METABOLIC PANEL
ANION GAP: 7 (ref 5–15)
BUN: 17 mg/dL (ref 6–20)
CHLORIDE: 100 mmol/L — AB (ref 101–111)
CO2: 31 mmol/L (ref 22–32)
Calcium: 9.3 mg/dL (ref 8.9–10.3)
Creatinine, Ser: 0.84 mg/dL (ref 0.44–1.00)
GFR calc non Af Amer: >60 mL/min (ref >60)
Glucose, Bld: 136 mg/dL — ABNORMAL HIGH (ref 65–99)
POTASSIUM: 3.4 mmol/L — AB (ref 3.5–5.1)
SODIUM: 138 mmol/L (ref 135–145)

## 2016-02-14 LAB — TROPONIN I: Troponin I: <0.03 ng/mL (ref <0.031)

## 2016-02-14 MED ORDER — HYDROCODONE-HOMATROPINE 5-1.5 MG/5ML PO SYRP: 5.0000 mL | ORAL_SOLUTION | Freq: Four times a day (QID) | ORAL | Status: DC | PRN

## 2016-02-14 MED ORDER — ALBUTEROL SULFATE (2.5 MG/3ML) 0.083% IN NEBU: 5.0000 mg | INHALATION_SOLUTION | Freq: Once | RESPIRATORY_TRACT | Status: DC

## 2016-02-14 MED ORDER — AZITHROMYCIN 500 MG PO TABS
500.0000 mg | ORAL_TABLET | Freq: Once | ORAL | Status: AC
  Administered 2016-02-14: 500 mg via ORAL
  Filled 2016-02-14: qty 1

## 2016-02-14 MED ORDER — AZITHROMYCIN 250 MG PO TABS: ORAL_TABLET | ORAL | Status: DC

## 2016-02-14 MED ORDER — HYDROCODONE-HOMATROPINE 5-1.5 MG/5ML PO SYRP
5.0000 mL | ORAL_SOLUTION | ORAL | Status: AC
  Administered 2016-02-14: 5 mL via ORAL

## 2016-02-14 NOTE — ED Notes (Signed)
Pt arrives to ER via POV c/o URI and cough X 1 week. Pt finished amoxicillin today, still taking steroids. Pt c/o cough and nausea. Pt alert and oriented X4, active, cooperative, pt in NAD. RR even and unlabored, color WNL.

## 2016-02-14 NOTE — ED Notes (Signed)
PT reports that she feels like she is having syncopal episodes at home from coughing a lot.

## 2016-02-14 NOTE — Discharge Instructions (Signed)

## 2016-02-14 NOTE — ED Provider Notes (Signed)
[facility] Emergency Department Provider Note  ____________________________________________  Time seen: Approximately 10:29 PM  I have reviewed the triage vital signs and the nursing notes.   HISTORY  Chief Complaint URI and Cough    HPI Karen Wiley is a 45 y.o. female reports she has a long history of seasonal allergies that cause coughing for in the past. Over the last week she has developed a cough that has been fairly consistent, and at times and she coughs very hard she feels lightheaded. She took amoxicillin for the last week which she finished as well as just finished a prednisone course and reports that she is continuing to have a persistent cough  She denies fevers or chills, no chest pain or trouble breathing except for episodes of severe coughing.  No recent surgeries, not taking any estrogens, no history of blood clots, no swelling, no leg swelling, no long trips or travels.  She denies having chest pain.  No wheezing.  Patient reports she just needs relief from this cough, and today she was sitting on a couch and when she had a coughing fit she got to the point that she have very lightheaded and almost passed out but did not. This lasted for only a few seconds after stopping coughing.  She does use inhalers, which sometimes provider some slight relief.  Past Medical History  Diagnosis Date  . HBP (high blood pressure)   . Sinus congestion   . Thyroid disorder     There are no active problems to display for this patient.   Past Surgical History  Procedure Laterality Date  . Partial hysterectomy  2007  . Tubal ligation  2007  . Cesarean section  2007  . Mouth surgery      Current Outpatient Rx  Name  Route  Sig  Dispense  Refill  . ibuprofen (ADVIL,MOTRIN) 200 MG tablet   Oral   Take 200 mg by mouth as needed for pain.         Marland Kitchen levothyroxine (SYNTHROID, LEVOTHROID) 175 MCG tablet   Oral   Take 200 mcg by mouth daily  before breakfast.          . lisinopril (PRINIVIL,ZESTRIL) 10 MG tablet   Oral   Take 10 mg by mouth daily.         Marland Kitchen loratadine (CLARITIN) 10 MG tablet   Oral   Take 10 mg by mouth daily.         Marland Kitchen lovastatin (MEVACOR) 10 MG tablet   Oral   Take 10 mg by mouth at bedtime.         . metoprolol succinate (TOPROL XL) 25 MG 24 hr tablet   Oral   Take 25 mg by mouth daily.           Allergies Review of patient's allergies indicates no known allergies.  No family history on file.  Social History Social History  Substance Use Topics  . Smoking status: Never Smoker   . Smokeless tobacco: Never Used  . Alcohol Use: No    Review of Systems Constitutional: No fever/chills Eyes: No visual changes. ENT: No sore throat. Cardiovascular: Denies chest pain. Respiratory: Denies shortness of breath. She is having a frequent nonproductive cough. Gastrointestinal: No abdominal pain.  No nausea, no vomiting.  No diarrhea.  No constipation. Genitourinary: Negative for dysuria. Musculoskeletal: Negative for back pain. Skin: Negative for rash. Neurological: Negative for headaches, focal weakness or numbness.  No travel history.  10-point ROS otherwise negative.  ____________________________________________   PHYSICAL EXAM:  VITAL SIGNS: ED Triage Vitals  Enc Vitals Group     BP 02/14/16 2023 183/65 mmHg     Pulse Rate 02/14/16 2023 93     Resp 02/14/16 2023 22     Temp 02/14/16 2023 98 F (36.7 C)     Temp Source 02/14/16 2023 Oral     SpO2 02/14/16 2023 98 %     Weight 02/14/16 2023 322 lb (146.058 kg)     Height 02/14/16 2023 5\' 10"  (1.778 m)     Head Cir --      Peak Flow --      Pain Score 02/14/16 2024 9     Pain Loc --      Pain Edu? --      Excl. in GC? --    Constitutional: Alert and oriented. Well appearing and in no acute distress. Eyes: Conjunctivae are normal. PERRL. EOMI. Head: Atraumatic. Nose: No congestion/rhinnorhea. Mouth/Throat: Mucous  membranes are moist.  Oropharynx non-erythematous. Neck: No stridor.   Cardiovascular: Normal rate, regular rhythm. Grossly normal heart sounds.  Good peripheral circulation. Respiratory: Normal respiratory effort.  No retractions. Lungs CTAB.Beats in full clear sentences. When the patient is asked to take a deep breath auscultate her lungs she has an episode of about 5-6 coughs, but did not get lightheaded on this occasion. Nonproductive. No wheezing is heard. Gastrointestinal: Soft and nontender. No distention. No abdominal bruits. No CVA tenderness. Musculoskeletal: No lower extremity tenderness nor edema.  No joint effusions. Neurologic:  Normal speech and language. No gross focal neurologic deficits are appreciated.Skin:  Skin is warm, dry and intact. No rash noted. Psychiatric: Mood and affect are normal. Speech and behavior are normal.  ____________________________________________   LABS (all labs ordered are listed, but only abnormal results are displayed)  Labs Reviewed  BASIC METABOLIC PANEL - Abnormal; Notable for the following:    Potassium 3.4 (*)    Chloride 100 (*)    Glucose, Bld 136 (*)    All other components within normal limits  CBC - Abnormal; Notable for the following:    WBC 17.2 (*)    RDW 14.7 (*)    All other components within normal limits  TROPONIN I   ____________________________________________  EKG  ED ECG REPORT I, QUALE, MARK, the attending physician, personally viewed and interpreted this ECG.  Date: 02/14/2016 EKG Time: 2030 Rate: 90 Rhythm: normal sinus rhythm QRS Axis: normal Intervals: normal ST/T Wave abnormalities: normal Conduction Disturbances: none Narrative Interpretation: unremarkable  ____________________________________________  RADIOLOGY   DG Chest 2 View (Final result) Result time: 02/14/16 21:25:01   Final result by Rad Results In Interface (02/14/16 21:25:01)   Narrative:   CLINICAL DATA: Nonproductive  cough  EXAM: CHEST 2 VIEW  COMPARISON: None.  FINDINGS: The lungs are clear. The pulmonary vasculature is normal. Heart size is normal. Hilar and mediastinal contours are unremarkable. There is no pleural effusion.  IMPRESSION: No active cardiopulmonary disease.   Electronically Signed By: Ellery Plunkaniel R Mitchell M.D. On: 02/14/2016 21:25       ____________________________________________   PROCEDURES  Procedure(s) performed: None  Critical Care performed: No  ____________________________________________   INITIAL IMPRESSION / ASSESSMENT AND PLAN / ED COURSE  Pertinent labs & imaging results that were available during my care of the patient were reviewed by me and considered in my medical decision making (see chart for details).   Patient presents for near syncope associated with a  coughing episode. She did not pass out. She similarly has no risk factors for pulmonary embolus him. Her presentation seems to be consistent with that of likely bronchitis, whether it is due to it a possible atypical infection versus allergy or other inflammatory etiology is somewhat unclear. She does have a notable leukocytosis, however this is in the setting of just finishing a steroid taper which leads me to be slightly less concerned with the possibility of obvious infectious cause. Her chest x-ray is clear and she is speaking in full and normal sentences aside from when she takes a deep inspiration or she has coughing. No hypoxia. No cardiac complaints.  Discussed with the patient and her family, we'll provide symptomatic relief with prescription cough medicine area she is agreeable not to drive, work and she places, or combine with other cough medication.  In addition I discussed risks and benefits of treatment for atypical infection, and the patient would like to be treated. I think it is reasonable to trial a course of azithromycin at this time and she was not treated for atypical  infection. She will follow closely with her primary care.  Return precautions and treatment recommendations and follow-up discussed with the patient who is agreeable with the plan.      Pulmonary Embolism Rule-out Criteria (PERC rule)                        If YES to ANY of the following, the PERC rule is not satisfied and cannot be used to rule out PE in this patient (consider d-dimer or imaging depending on pre-test probability).                      If NO to ALL of the following, AND the clinician's pre-test probability is <15%, the [facility] rule is satisfied and there is no need for further workup (including no need to obtain a d-dimer) as the post-test probability of pulmonary embolism is <2%.                      Mnemonic is HAD CLOTS   H - hormone use (exogenous estrogen)      No. A - age > 50                                                 No. D - DVT/PE history                                      No.   C - coughing blood (hemoptysis)                 No. L - leg swelling, unilateral                             No. O - O2 Sat on Room Air < 95%                  No. T - tachycardia (HR ? 100)                         No. S - surgery or trauma,  recent                      No.   Based on my evaluation of the patient, including application of this decision instrument, further testing to evaluate for pulmonary embolism is not indicated at this time.    ____________________________________________   FINAL CLINICAL IMPRESSION(S) / ED DIAGNOSES  Final diagnoses:  Post-tussive near-syncope  Acute bronchitis, unspecified organism      Sharyn Creamer, MD 02/14/16 2238

## 2016-02-17 ENCOUNTER — Inpatient Hospital Stay: Payer: BLUE CROSS/BLUE SHIELD | Attending: Oncology | Admitting: Oncology

## 2016-02-17 ENCOUNTER — Inpatient Hospital Stay: Payer: BLUE CROSS/BLUE SHIELD

## 2016-02-17 VITALS — BP 155/88 | HR 97 | Temp 98.7°F | Resp 18 | Wt 323.2 lb

## 2016-02-17 DIAGNOSIS — I10 Essential (primary) hypertension: Secondary | ICD-10-CM

## 2016-02-17 DIAGNOSIS — D72829 Elevated white blood cell count, unspecified: Secondary | ICD-10-CM

## 2016-02-17 DIAGNOSIS — Z79899 Other long term (current) drug therapy: Secondary | ICD-10-CM | POA: Diagnosis not present

## 2016-02-17 DIAGNOSIS — R05 Cough: Secondary | ICD-10-CM

## 2016-02-17 DIAGNOSIS — Z7984 Long term (current) use of oral hypoglycemic drugs: Secondary | ICD-10-CM

## 2016-02-17 DIAGNOSIS — Z7952 Long term (current) use of systemic steroids: Secondary | ICD-10-CM | POA: Insufficient documentation

## 2016-02-17 LAB — CBC WITH DIFFERENTIAL/PLATELET
BASOS PCT: 1 %
Basophils Absolute: 0.1 10*3/uL (ref 0–0.1)
EOS ABS: 0.1 10*3/uL (ref 0–0.7)
Eosinophils Relative: 1 %
HCT: 42.7 % (ref 35.0–47.0)
HEMOGLOBIN: 14.5 g/dL (ref 12.0–16.0)
Lymphocytes Relative: 13 %
Lymphs Abs: 2.3 10*3/uL (ref 1.0–3.6)
MCH: 28.5 pg (ref 26.0–34.0)
MCHC: 33.8 g/dL (ref 32.0–36.0)
MCV: 84.1 fL (ref 80.0–100.0)
MONO ABS: 1 10*3/uL — AB (ref 0.2–0.9)
MONOS PCT: 6 %
NEUTROS PCT: 79 %
Neutro Abs: 14.2 10*3/uL — ABNORMAL HIGH (ref 1.4–6.5)
PLATELETS: 294 10*3/uL (ref 150–440)
RBC: 5.08 MIL/uL (ref 3.80–5.20)
RDW: 14.4 % (ref 11.5–14.5)
WBC: 17.7 10*3/uL — ABNORMAL HIGH (ref 3.6–11.0)

## 2016-02-17 NOTE — Progress Notes (Signed)
Patient has elevated WBC when she had no signs or symptoms.  She is now not feeling well with cough that has been treated with 2 rounds of antibiotics and is one Prednisone taper pack with 3 more days to take but none of these meds have helped with the cough.

## 2016-02-23 LAB — BCR-ABL1, CML/ALL, PCR, QUANT

## 2016-02-23 LAB — COMP PANEL: LEUKEMIA/LYMPHOMA

## 2016-02-29 NOTE — Progress Notes (Signed)
[place]  Telephone:([phone]) [phone] Fax:([phone]) [phone]  ID: Karen Wiley OB: Jul 11, 1971  MR#: [id number]  EHM#:[id number]  Patient Care Team: Trude Mcburney Dear, MD as PCP - General (Family Medicine)  CHIEF COMPLAINT:  Chief Complaint  Patient presents with  . New Evaluation    INTERVAL HISTORY: Patient is a 45 year old female who was found to have a persistently elevated white blood cell count on routine lab work. Currently, she does not feel well and persistent cough that has been treated with 2 rounds of antibiotics. She is currently also on a prednisone taper. Patient reports her white blood cell count was elevated prior to her cough and prednisone. She otherwise feels well. She denies any fevers. She has a good appetite and denies weight loss. She has no chest pain or shortness of breath. She denies any nausea, vomiting, constipation, or diarrhea. She has no urinary complaints. Patient otherwise feels well and offers no further specific complaints.  REVIEW OF SYSTEMS:   Review of Systems  Constitutional: Negative.  Negative for fever and weight loss.  Respiratory: Positive for cough. Negative for hemoptysis and shortness of breath.   Cardiovascular: Negative.  Negative for chest pain.  Gastrointestinal: Negative.  Negative for nausea and vomiting.  Genitourinary: Negative.  Negative for dysuria, urgency and frequency.  Skin: Negative for rash.  Neurological: Negative.  Negative for weakness.  Psychiatric/Behavioral: Negative.     As per HPI. Otherwise, a complete review of systems is negatve.  PAST MEDICAL HISTORY: Past Medical History  Diagnosis Date  . HBP (high blood pressure)   . Sinus congestion   . Thyroid disorder     PAST SURGICAL HISTORY: Past Surgical History  Procedure Laterality Date  . Partial hysterectomy  2007  . Tubal ligation  2007  . Cesarean section  2007  . Mouth surgery      FAMILY HISTORY:  Reviewed and unchanged. No  reported history of malignancy or chronic disease.     ADVANCED DIRECTIVES:    HEALTH MAINTENANCE: Social History  Substance Use Topics  . Smoking status: Never Smoker   . Smokeless tobacco: Never Used  . Alcohol Use: No     Colonoscopy:  PAP:  Bone density:  Lipid panel:  Allergies  Allergen Reactions  . Ciprofloxacin Swelling  . Tape     Current Outpatient Prescriptions  Medication Sig Dispense Refill  . azithromycin (ZITHROMAX) 250 MG tablet Take 1 tablet daily by mouth 4 tablet 0  . lisinopril (PRINIVIL,ZESTRIL) 10 MG tablet Take 20 mg by mouth daily.     Marland Kitchen lovastatin (MEVACOR) 10 MG tablet Take 40 mg by mouth at bedtime.     . metFORMIN (GLUCOPHAGE) 500 MG tablet Take 1,000 mg by mouth daily with breakfast.     . predniSONE (DELTASONE) 10 MG tablet      No current facility-administered medications for this visit.    OBJECTIVE: Filed Vitals:   02/17/16 1013  BP: 155/88  Pulse: 97  Temp: 98.7 F (37.1 C)  Resp: 18     Body mass index is 46.37 kg/(m^2).    ECOG FS:0 - Asymptomatic  General: Well-developed, well-nourished, no acute distress. Eyes: Pink conjunctiva, anicteric sclera. HEENT: Normocephalic, moist mucous membranes, clear oropharnyx. Lungs: Clear to auscultation bilaterally. Heart: Regular rate and rhythm. No rubs, murmurs, or gallops. Abdomen: Soft, nontender, nondistended. No organomegaly noted, normoactive bowel sounds. Musculoskeletal: No edema, cyanosis, or clubbing. Neuro: Alert, answering all questions appropriately. Cranial nerves grossly intact. Skin: No rashes or  petechiae noted. Psych: Normal affect. Lymphatics: No cervical, calvicular, axillary or inguinal LAD.   LAB RESULTS:  Lab Results  Component Value Date   NA 138 02/14/2016   K 3.4* 02/14/2016   CL 100* 02/14/2016   CO2 31 02/14/2016   GLUCOSE 136* 02/14/2016   BUN 17 02/14/2016   CREATININE 0.84 02/14/2016   CALCIUM 9.3 02/14/2016   GFRNONAA >60 02/14/2016   GFRAA  >60 02/14/2016    Lab Results  Component Value Date   WBC 17.7* 02/17/2016   NEUTROABS 14.2* 02/17/2016   HGB 14.5 02/17/2016   HCT 42.7 02/17/2016   MCV 84.1 02/17/2016   PLT 294 02/17/2016     STUDIES: Dg Chest 2 View  02/14/2016  CLINICAL DATA:  Nonproductive cough EXAM: CHEST  2 VIEW COMPARISON:  None. FINDINGS: The lungs are clear. The pulmonary vasculature is normal. Heart size is normal. Hilar and mediastinal contours are unremarkable. There is no pleural effusion. IMPRESSION: No active cardiopulmonary disease. Electronically Signed   By: Andreas Newport M.D.   On: 02/14/2016 21:25    ASSESSMENT: Leukocytosis, likely reactive.  PLAN:    1. Leukocytosis: Patient reports her white blood cell count was reported elevated prior to her persistent cough and prednisone taper. Her white blood cell count is 17.7 today which is essentially unchanged. Peripheral blood flow cytometry as well as BCR-ABL are negative. The remainder of her laboratory work is also either negative or within normal limits. No intervention is needed at this time. Patient does not require bone marrow biopsy. Patient has been instructed to complete her antibiotics and prednisone taper. Return to clinic in 3 months with repeat laboratory work and further evaluation.  Patient expressed understanding and was in agreement with this plan. She also understands that She can call clinic at any time with any questions, concerns, or complaints.    Lloyd Huger, MD   02/29/2016 8:31 AM

## 2016-03-01 ENCOUNTER — Encounter: Payer: Self-pay | Admitting: Oncology

## 2016-03-04 ENCOUNTER — Telehealth: Payer: Self-pay | Admitting: *Deleted

## 2016-03-04 NOTE — Telephone Encounter (Signed)
Patient notified of lab results, keep follow up in August as scheduled. Patient verbalized understanding of plan.

## 2016-05-19 ENCOUNTER — Inpatient Hospital Stay (HOSPITAL_BASED_OUTPATIENT_CLINIC_OR_DEPARTMENT_OTHER): Payer: BLUE CROSS/BLUE SHIELD | Admitting: Oncology

## 2016-05-19 ENCOUNTER — Inpatient Hospital Stay: Payer: BLUE CROSS/BLUE SHIELD | Attending: Oncology

## 2016-05-19 DIAGNOSIS — R0981 Nasal congestion: Secondary | ICD-10-CM

## 2016-05-19 DIAGNOSIS — E079 Disorder of thyroid, unspecified: Secondary | ICD-10-CM

## 2016-05-19 DIAGNOSIS — D72829 Elevated white blood cell count, unspecified: Secondary | ICD-10-CM | POA: Insufficient documentation

## 2016-05-19 DIAGNOSIS — I10 Essential (primary) hypertension: Secondary | ICD-10-CM | POA: Diagnosis not present

## 2016-05-19 LAB — CBC WITH DIFFERENTIAL/PLATELET
BASOS ABS: 0.1 10*3/uL (ref 0–0.1)
BASOS PCT: 1 %
EOS ABS: 0.5 10*3/uL (ref 0–0.7)
EOS PCT: 5 %
HEMATOCRIT: 37.6 % (ref 35.0–47.0)
HEMOGLOBIN: 13.1 g/dL (ref 12.0–16.0)
LYMPHS ABS: 2.8 10*3/uL (ref 1.0–3.6)
LYMPHS PCT: 28 %
MCH: 29.6 pg (ref 26.0–34.0)
MCHC: 34.9 g/dL (ref 32.0–36.0)
MCV: 84.8 fL (ref 80.0–100.0)
MONO ABS: 0.7 10*3/uL (ref 0.2–0.9)
MONOS PCT: 7 %
NEUTROS ABS: 5.6 10*3/uL (ref 1.4–6.5)
NEUTROS PCT: 59 %
Platelets: 255 10*3/uL (ref 150–440)
RBC: 4.43 MIL/uL (ref 3.80–5.20)
RDW: 14.5 % (ref 11.5–14.5)
WBC: 9.7 10*3/uL (ref 3.6–11.0)

## 2016-05-19 NOTE — Progress Notes (Signed)
Pt reports feeling better from respiratory virus that she had last visit.

## 2016-05-19 NOTE — Progress Notes (Signed)
[place]  [phone] [phone]) [phone]  ID: Karen Wiley OB: 1971/03/05  MR#: [id number]  VQM#:[id number]  Patient Care Team: Trude Mcburney Dear, MD (Inactive) as PCP - General (Family Medicine)  CHIEF COMPLAINT: Leukocytosis  INTERVAL HISTORY: Patient returns to clinic today for repeat laboratory work and further evaluation. She currently feels well and is asymptomatic. She has no neurologic complaints. She denies any fevers. She has a good appetite and denies weight loss. She has no chest pain or shortness of breath. She denies any nausea, vomiting, constipation, or diarrhea. She has no urinary complaints. Patient offers no specific complaints today.   REVIEW OF SYSTEMS:   Review of Systems  Constitutional: Negative.  Negative for fever, malaise/fatigue and weight loss.  Respiratory: Negative for cough, hemoptysis and shortness of breath.   Cardiovascular: Negative.  Negative for chest pain.  Gastrointestinal: Negative.  Negative for nausea and vomiting.  Genitourinary: Negative.  Negative for dysuria, frequency and urgency.  Skin: Negative for rash.  Neurological: Negative.  Negative for weakness.  Psychiatric/Behavioral: Negative.  The patient is not nervous/anxious.     As per HPI. Otherwise, a complete review of systems is negatve.  PAST MEDICAL HISTORY: Past Medical History:  Diagnosis Date  . HBP (high blood pressure)   . Sinus congestion   . Thyroid disorder     PAST SURGICAL HISTORY: Past Surgical History:  Procedure Laterality Date  . CESAREAN SECTION  2007  . MOUTH SURGERY    . PARTIAL HYSTERECTOMY  2007  . TUBAL LIGATION  2007    FAMILY HISTORY:  Reviewed and unchanged. No reported history of malignancy or chronic disease.     ADVANCED DIRECTIVES:    HEALTH MAINTENANCE: Social History  Substance Use Topics  . Smoking status: Never Smoker  . Smokeless tobacco: Never Used  . Alcohol use No      Colonoscopy:  PAP:  Bone density:  Lipid panel:  Allergies  Allergen Reactions  . Ciprofloxacin Swelling  . Tape     Current Outpatient Prescriptions  Medication Sig Dispense Refill  . hydrochlorothiazide (HYDRODIURIL) 25 MG tablet     . lisinopril (PRINIVIL,ZESTRIL) 10 MG tablet Take 20 mg by mouth daily.     Marland Kitchen lovastatin (MEVACOR) 10 MG tablet Take 40 mg by mouth at bedtime.     . metFORMIN (GLUCOPHAGE) 500 MG tablet Take 1,000 mg by mouth daily with breakfast.     . SYNTHROID 175 MCG tablet     . Vitamin D, Ergocalciferol, (DRISDOL) 50000 units CAPS capsule Take by mouth.     No current facility-administered medications for this visit.     OBJECTIVE: Vitals:   05/19/16 1401  BP: (!) 154/74  Pulse: 87  Resp: 17  Temp: 97 F (36.1 C)     Body mass index is 48.43 kg/m.    ECOG FS:0 - Asymptomatic  General: Well-developed, well-nourished, no acute distress. Eyes: Pink conjunctiva, anicteric sclera. Lungs: Clear to auscultation bilaterally. Heart: Regular rate and rhythm. No rubs, murmurs, or gallops. Abdomen: Soft, nontender, nondistended. No organomegaly noted, normoactive bowel sounds. Musculoskeletal: No edema, cyanosis, or clubbing. Neuro: Alert, answering all questions appropriately. Cranial nerves grossly intact. Skin: No rashes or petechiae noted. Psych: Normal affect.   LAB RESULTS:  Lab Results  Component Value Date   NA 138 02/14/2016   K 3.4 (L) 02/14/2016   CL 100 (L) 02/14/2016   CO2 31 02/14/2016   GLUCOSE 136 (H) 02/14/2016   BUN 17 02/14/2016  CREATININE 0.84 02/14/2016   CALCIUM 9.3 02/14/2016   GFRNONAA >60 02/14/2016   GFRAA >60 02/14/2016    Lab Results  Component Value Date   WBC 9.7 05/19/2016   NEUTROABS 5.6 05/19/2016   HGB 13.1 05/19/2016   HCT 37.6 05/19/2016   MCV 84.8 05/19/2016   PLT 255 05/19/2016     STUDIES: No results found.  ASSESSMENT: Leukocytosis, Resolved.  PLAN:    1. Leukocytosis: Patient  reports her white blood cell count is now within normal limits. Previously, the remainder of her blood work including peripheral blood flow cytometry as well as BCR-ABLwas negative. No intervention is needed at this time. Patient does not require bone marrow biopsy. No further follow-up is necessary.  Patient expressed understanding and was in agreement with this plan. She also understands that She can call clinic at any time with any questions, concerns, or complaints.    Lloyd Huger, MD   05/20/2016 12:14 AM

## 2016-12-15 ENCOUNTER — Other Ambulatory Visit: Payer: Self-pay | Admitting: Family Medicine

## 2016-12-15 DIAGNOSIS — Z1231 Encounter for screening mammogram for malignant neoplasm of breast: Secondary | ICD-10-CM

## 2017-01-06 ENCOUNTER — Ambulatory Visit
Admission: RE | Admit: 2017-01-06 | Discharge: 2017-01-06 | Disposition: A | Discharge status: Home / Self Care | Payer: BLUE CROSS/BLUE SHIELD | Source: Ambulatory Visit | Attending: Family Medicine | Admitting: Family Medicine

## 2017-01-06 DIAGNOSIS — Z1231 Encounter for screening mammogram for malignant neoplasm of breast: Secondary | ICD-10-CM | POA: Insufficient documentation

## 2017-01-12 ENCOUNTER — Other Ambulatory Visit: Payer: Self-pay | Admitting: *Deleted

## 2017-01-12 ENCOUNTER — Inpatient Hospital Stay
Admission: RE | Admit: 2017-01-12 | Discharge: 2017-01-12 | Disposition: A | Discharge status: Home / Self Care | Payer: Self-pay | Source: Ambulatory Visit | Attending: *Deleted | Admitting: *Deleted

## 2017-01-12 DIAGNOSIS — Z9289 Personal history of other medical treatment: Secondary | ICD-10-CM

## 2017-06-28 ENCOUNTER — Other Ambulatory Visit: Payer: Self-pay | Admitting: Orthopedic Surgery

## 2017-06-28 DIAGNOSIS — M1712 Unilateral primary osteoarthritis, left knee: Secondary | ICD-10-CM

## 2017-06-28 DIAGNOSIS — M25562 Pain in left knee: Secondary | ICD-10-CM

## 2017-06-28 DIAGNOSIS — M2392 Unspecified internal derangement of left knee: Secondary | ICD-10-CM

## 2017-07-06 ENCOUNTER — Ambulatory Visit: Payer: BLUE CROSS/BLUE SHIELD

## 2017-08-03 ENCOUNTER — Ambulatory Visit
Admission: RE | Admit: 2017-08-03 | Discharge: 2017-08-03 | Disposition: A | Discharge status: Home / Self Care | Payer: BLUE CROSS/BLUE SHIELD | Source: Ambulatory Visit | Attending: Orthopedic Surgery | Admitting: Orthopedic Surgery

## 2017-08-03 DIAGNOSIS — M2392 Unspecified internal derangement of left knee: Secondary | ICD-10-CM | POA: Diagnosis present

## 2017-08-03 DIAGNOSIS — M25562 Pain in left knee: Secondary | ICD-10-CM | POA: Diagnosis present

## 2017-08-03 DIAGNOSIS — M1712 Unilateral primary osteoarthritis, left knee: Secondary | ICD-10-CM | POA: Diagnosis present

## 2017-08-03 DIAGNOSIS — M23252 Derangement of posterior horn of lateral meniscus due to old tear or injury, left knee: Secondary | ICD-10-CM | POA: Insufficient documentation

## 2017-08-03 DIAGNOSIS — M23222 Derangement of posterior horn of medial meniscus due to old tear or injury, left knee: Secondary | ICD-10-CM | POA: Insufficient documentation

## 2017-08-15 IMAGING — CR DG CHEST 2V
2 series · 2 of 2 positions shown · non-contrast
Comparison: None.

CLINICAL DATA: Nonproductive cough

EXAM:
CHEST  2 VIEW

[chest pa]
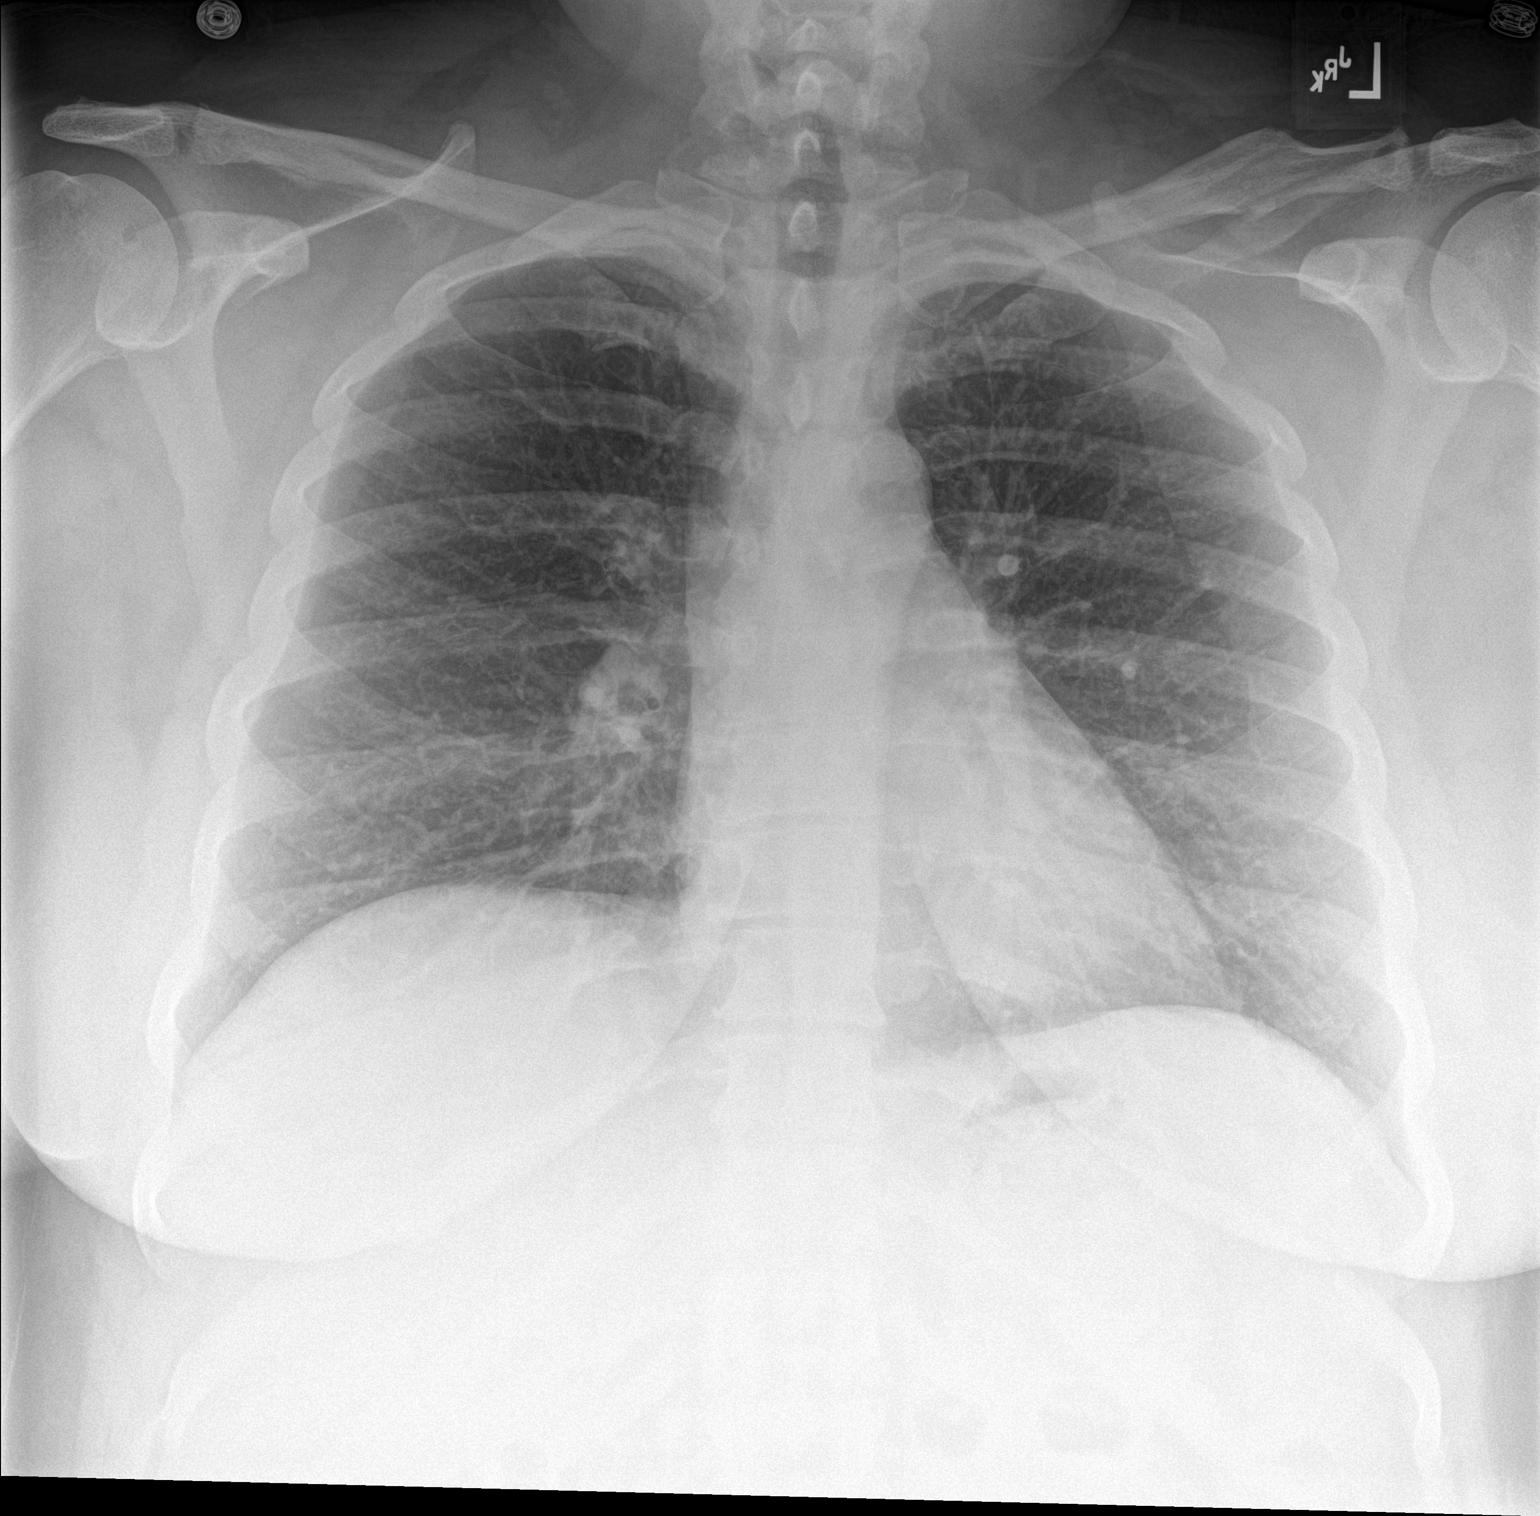

[chest lat]
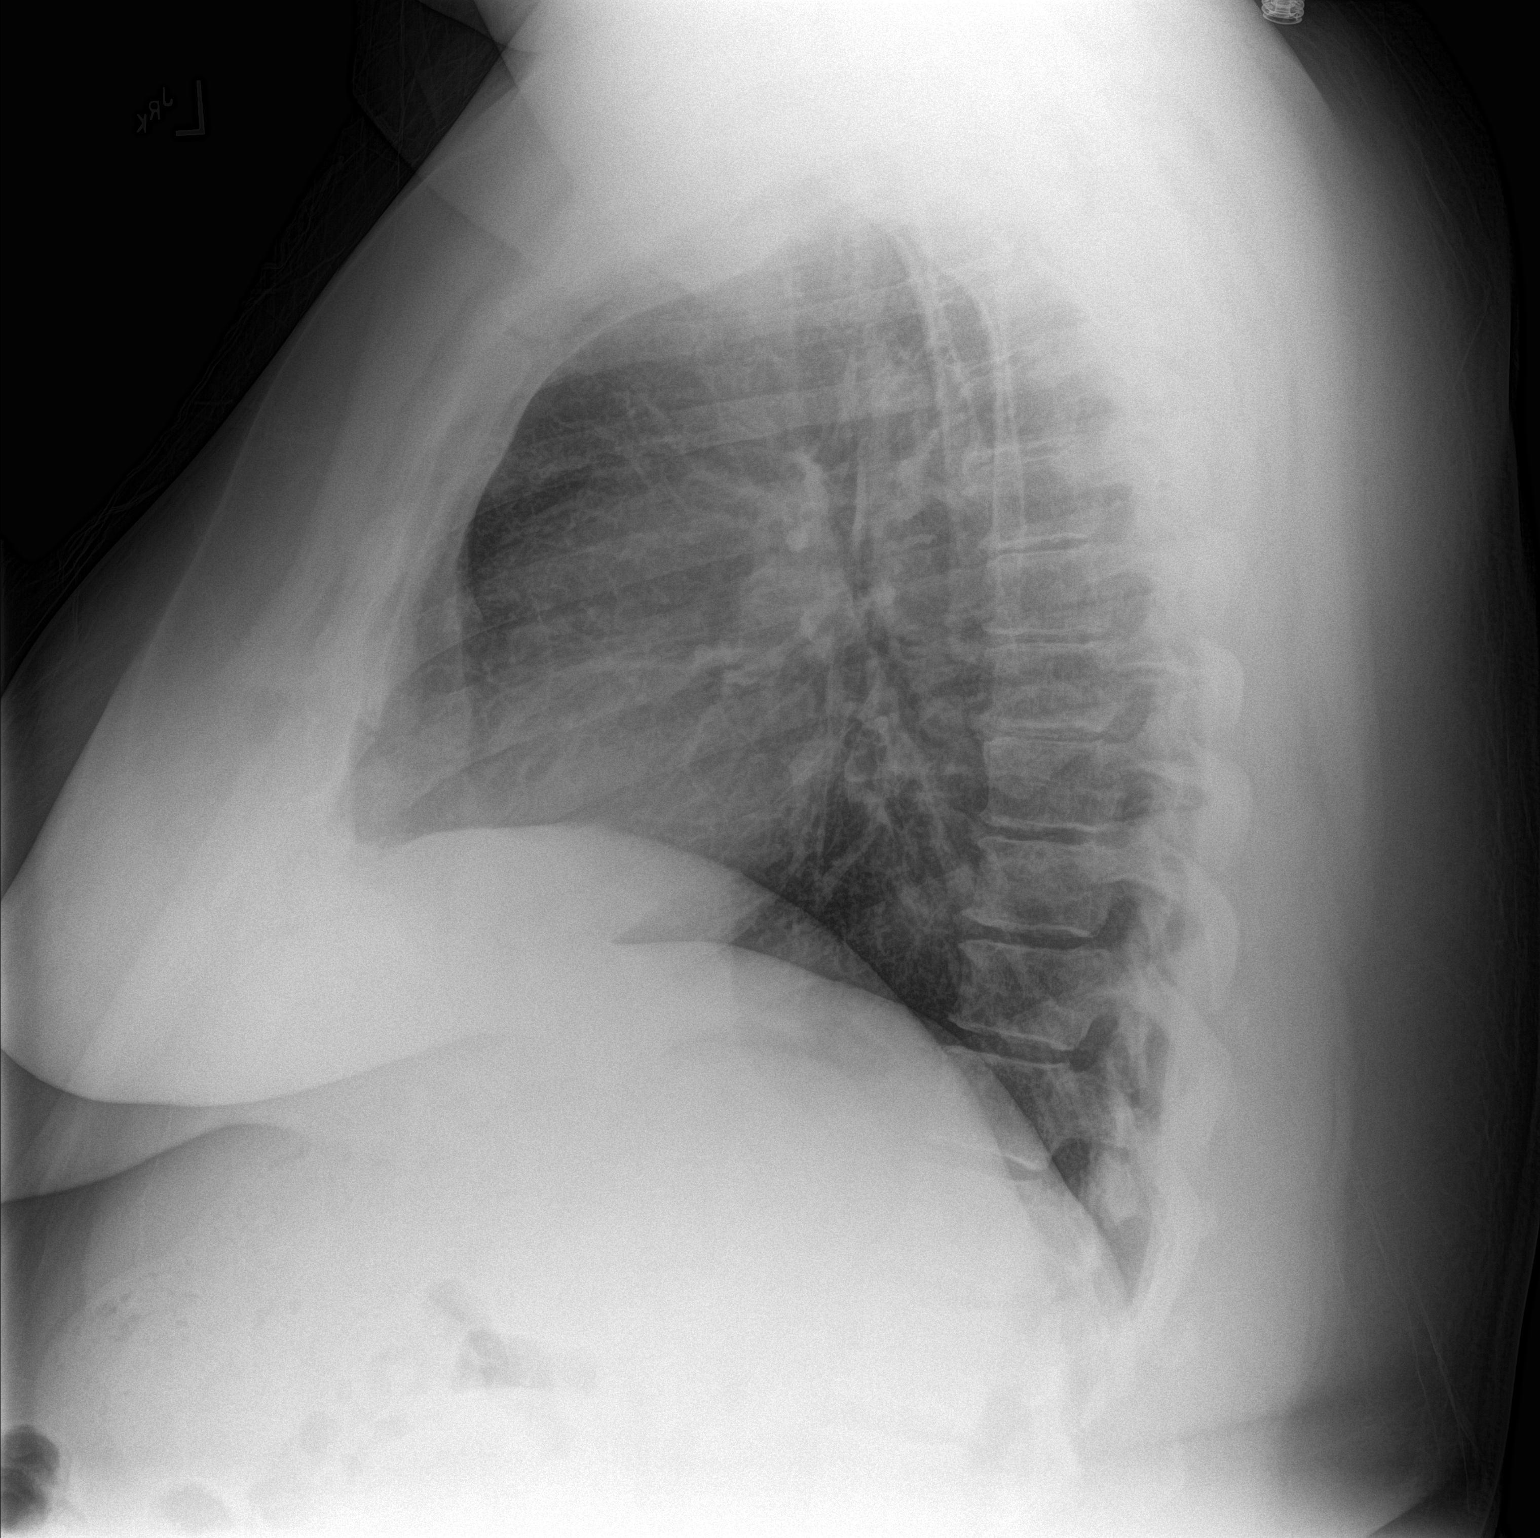

[2 of 2 positions shown; findings below may reference images not displayed]

FINDINGS: The lungs are clear. The pulmonary vasculature is normal. Heart size
is normal. Hilar and mediastinal contours are unremarkable. There is
no pleural effusion.
IMPRESSION: No active cardiopulmonary disease.

## 2017-08-24 ENCOUNTER — Other Ambulatory Visit: Payer: Self-pay

## 2017-08-24 ENCOUNTER — Encounter
Admission: RE | Admit: 2017-08-24 | Discharge: 2017-08-24 | Disposition: A | Discharge status: Home / Self Care | Payer: BLUE CROSS/BLUE SHIELD | Source: Ambulatory Visit | Attending: Orthopedic Surgery | Admitting: Orthopedic Surgery

## 2017-08-24 DIAGNOSIS — Z01810 Encounter for preprocedural cardiovascular examination: Secondary | ICD-10-CM | POA: Insufficient documentation

## 2017-08-24 DIAGNOSIS — I10 Essential (primary) hypertension: Secondary | ICD-10-CM | POA: Insufficient documentation

## 2017-08-24 DIAGNOSIS — Z01812 Encounter for preprocedural laboratory examination: Secondary | ICD-10-CM | POA: Diagnosis not present

## 2017-08-24 HISTORY — DX: Sleep apnea, unspecified: G47.30

## 2017-08-24 HISTORY — DX: Hypothyroidism, unspecified: E03.9

## 2017-08-24 HISTORY — DX: Type 2 diabetes mellitus without complications: E11.9

## 2017-08-24 LAB — BASIC METABOLIC PANEL
ANION GAP: 10 (ref 5–15)
BUN: 18 mg/dL (ref 6–20)
CHLORIDE: 99 mmol/L — AB (ref 101–111)
CO2: 29 mmol/L (ref 22–32)
Calcium: 9.6 mg/dL (ref 8.9–10.3)
Creatinine, Ser: 0.86 mg/dL (ref 0.44–1.00)
GFR calc non Af Amer: >60 mL/min (ref >60)
Glucose, Bld: 122 mg/dL — ABNORMAL HIGH (ref 65–99)
POTASSIUM: 3.7 mmol/L (ref 3.5–5.1)
SODIUM: 138 mmol/L (ref 135–145)

## 2017-08-24 LAB — CBC
HCT: 40.1 % (ref 35.0–47.0)
HEMOGLOBIN: 13.6 g/dL (ref 12.0–16.0)
MCH: 28.9 pg (ref 26.0–34.0)
MCHC: 33.9 g/dL (ref 32.0–36.0)
MCV: 85.2 fL (ref 80.0–100.0)
Platelets: 264 10*3/uL (ref 150–440)
RBC: 4.7 MIL/uL (ref 3.80–5.20)
RDW: 14.8 % — ABNORMAL HIGH (ref 11.5–14.5)
WBC: 8.8 10*3/uL (ref 3.6–11.0)

## 2017-08-24 NOTE — Patient Instructions (Signed)
Your procedure is scheduled on: Monday, August 28, 2017  Report to [facility], 2ND FLOOR  To find out your arrival time please call [phone] between 1PM - 3PM on Friday, August 25, 2017  Remember: Instructions that are not followed completely may result in serious medical risk, up to and including death, or upon the discretion of your surgeon and anesthesiologist your surgery may need to be rescheduled.     _X__ 1. Do not eat food after midnight the night before your procedure.                 No gum chewing or hard candies. You may drink clear liquids up to 2 hours                 before you are scheduled to arrive for your surgery- DO not drink clear                 liquids within 2 hours of the start of your surgery.                 Clear Liquids include:  water, apple juice without pulp, clear carbohydrate                 drink such as Clearfast of Gartorade, Black Coffee or Tea (Do not add                 anything to coffee or tea).     _X__ 2.  No Alcohol for 24 hours before or after surgery.   _X__ 3.  Do Not Smoke or use e-cigarettes For 24 Hours Prior to Your Surgery.                 Do not use any chewable tobacco products for at least 6 hours prior to                 surgery.  ____  4.  Bring all medications with you on the day of surgery if instructed.   ____  5.  Notify your doctor if there is any change in your medical condition      (cold, fever, infections).     Do not wear jewelry, make-up, hairpins, clips or nail polish. Do not wear lotions, powders, or perfumes. You may wear deodorant. Do not shave 48 hours prior to surgery. Men may shave face and neck. Do not bring valuables to the hospital.    [facility] is not responsible for any belongings or valuables.  Contacts, dentures or bridgework may not be worn into surgery. Leave your suitcase in the car. After surgery it may be brought to your room. For patients admitted to the  hospital, discharge time is determined by your treatment team.   Patients discharged the day of surgery will not be allowed to drive home.   Please read over the following fact sheets that you were given:   PREPARING FOR SURGERY    ____ Take these medicines the morning of surgery with A SIP OF WATER:    1. SYNTHROID  2.  MEVACOR  3.  CLARITIN  4.  TYLENOL, IF NEEDED  5.  6.  ____ Fleet Enema (as directed)   __X__ Use CHG Soap as directed  ____ Use inhalers on the day of surgery  __X__ Stop metformin 2 days prior to surgery. LAST DOSE 08/25/2017    ____ Take 1/2 of usual insulin dose the night before surgery. No insulin the morning  of surgery.   _X___ Stop Anti-inflammatories AS OF TODAY , August 24, 2017  THIS INCLUDES MELOXICAM, IBUPROFEN/ADVIL/ALEVE  __X_ Stop supplements until after surgery.  THIS INCLUDES VITAMIN D. STOP TODAY  __X__ Bring C-Pap to the hospital.   CONTINUE TAKING ALL OTHER MEDICATION UP UNTIL THE DAY OF SURGERY, BUT NOT THE DAY OF SURGERY.

## 2017-08-28 ENCOUNTER — Ambulatory Visit
Admission: RE | Admit: 2017-08-28 | Discharge: 2017-08-28 | Disposition: A | Discharge status: Home / Self Care | Payer: BLUE CROSS/BLUE SHIELD | Source: Ambulatory Visit | Attending: Orthopedic Surgery | Admitting: Orthopedic Surgery

## 2017-08-28 ENCOUNTER — Ambulatory Visit: Payer: BLUE CROSS/BLUE SHIELD | Admitting: Certified Registered Nurse Anesthetist

## 2017-08-28 ENCOUNTER — Encounter: Payer: Self-pay | Admitting: *Deleted

## 2017-08-28 ENCOUNTER — Encounter
Admission: RE | Disposition: A | Discharge status: Home / Self Care | Payer: Self-pay | Source: Ambulatory Visit | Attending: Orthopedic Surgery

## 2017-08-28 DIAGNOSIS — I10 Essential (primary) hypertension: Secondary | ICD-10-CM | POA: Diagnosis not present

## 2017-08-28 DIAGNOSIS — Z6841 Body Mass Index (BMI) 40.0 and over, adult: Secondary | ICD-10-CM | POA: Insufficient documentation

## 2017-08-28 DIAGNOSIS — S83282A Other tear of lateral meniscus, current injury, left knee, initial encounter: Secondary | ICD-10-CM | POA: Insufficient documentation

## 2017-08-28 DIAGNOSIS — Z79899 Other long term (current) drug therapy: Secondary | ICD-10-CM | POA: Insufficient documentation

## 2017-08-28 DIAGNOSIS — G4730 Sleep apnea, unspecified: Secondary | ICD-10-CM | POA: Diagnosis not present

## 2017-08-28 DIAGNOSIS — M1712 Unilateral primary osteoarthritis, left knee: Secondary | ICD-10-CM | POA: Diagnosis not present

## 2017-08-28 DIAGNOSIS — E039 Hypothyroidism, unspecified: Secondary | ICD-10-CM | POA: Diagnosis not present

## 2017-08-28 DIAGNOSIS — S83242A Other tear of medial meniscus, current injury, left knee, initial encounter: Secondary | ICD-10-CM | POA: Diagnosis not present

## 2017-08-28 DIAGNOSIS — E119 Type 2 diabetes mellitus without complications: Secondary | ICD-10-CM | POA: Diagnosis not present

## 2017-08-28 HISTORY — DX: Failed or difficult intubation, initial encounter: T88.4XXA

## 2017-08-28 HISTORY — PX: CHONDROPLASTY: SHX5177

## 2017-08-28 HISTORY — PX: KNEE ARTHROSCOPY WITH MENISCAL REPAIR: SHX5653

## 2017-08-28 LAB — GLUCOSE, CAPILLARY
GLUCOSE-CAPILLARY: 119 mg/dL — AB (ref 65–99)
GLUCOSE-CAPILLARY: 141 mg/dL — AB (ref 65–99)

## 2017-08-28 SURGERY — ARTHROSCOPY, KNEE, WITH MENISCUS REPAIR
Anesthesia: General | Laterality: Left | Wound class: Clean

## 2017-08-28 MED ORDER — MIDAZOLAM HCL 2 MG/2ML IJ SOLN
INTRAMUSCULAR | Status: AC
  Filled 2017-08-28: qty 2

## 2017-08-28 MED ORDER — FENTANYL CITRATE (PF) 100 MCG/2ML IJ SOLN
INTRAMUSCULAR | Status: AC
  Administered 2017-08-28: 25 ug via INTRAVENOUS
  Filled 2017-08-28: qty 2

## 2017-08-28 MED ORDER — FENTANYL CITRATE (PF) 100 MCG/2ML IJ SOLN
INTRAMUSCULAR | Status: AC
  Filled 2017-08-28: qty 2

## 2017-08-28 MED ORDER — PHENYLEPHRINE HCL 10 MG/ML IJ SOLN
INTRAMUSCULAR | Status: AC
  Filled 2017-08-28: qty 1

## 2017-08-28 MED ORDER — SUGAMMADEX SODIUM 200 MG/2ML IV SOLN
INTRAVENOUS | Status: DC | PRN
  Administered 2017-08-28: 325 mg via INTRAVENOUS

## 2017-08-28 MED ORDER — ROCURONIUM BROMIDE 100 MG/10ML IV SOLN
INTRAVENOUS | Status: DC | PRN
  Administered 2017-08-28: 30 mg via INTRAVENOUS

## 2017-08-28 MED ORDER — LIDOCAINE HCL (PF) 2 % IJ SOLN
INTRAMUSCULAR | Status: AC
  Filled 2017-08-28: qty 10

## 2017-08-28 MED ORDER — SUCCINYLCHOLINE CHLORIDE 20 MG/ML IJ SOLN
INTRAMUSCULAR | Status: DC | PRN
  Administered 2017-08-28 (×2): 100 mg via INTRAVENOUS

## 2017-08-28 MED ORDER — DEXAMETHASONE SODIUM PHOSPHATE 10 MG/ML IJ SOLN
INTRAMUSCULAR | Status: DC | PRN
  Administered 2017-08-28: 10 mg via INTRAVENOUS

## 2017-08-28 MED ORDER — FENTANYL CITRATE (PF) 100 MCG/2ML IJ SOLN
INTRAMUSCULAR | Status: DC | PRN
  Administered 2017-08-28 (×2): 50 ug via INTRAVENOUS

## 2017-08-28 MED ORDER — EPHEDRINE SULFATE 50 MG/ML IJ SOLN
INTRAMUSCULAR | Status: AC
  Filled 2017-08-28: qty 1

## 2017-08-28 MED ORDER — ACETAMINOPHEN 10 MG/ML IV SOLN
INTRAVENOUS | Status: AC
  Filled 2017-08-28: qty 100

## 2017-08-28 MED ORDER — PROPOFOL 10 MG/ML IV BOLUS
INTRAVENOUS | Status: DC | PRN
  Administered 2017-08-28: 100 mg via INTRAVENOUS
  Administered 2017-08-28: 200 mg via INTRAVENOUS

## 2017-08-28 MED ORDER — ASPIRIN EC 325 MG PO TBEC: 325.0000 mg | DELAYED_RELEASE_TABLET | Freq: Every day | ORAL | 0 refills | Status: AC

## 2017-08-28 MED ORDER — ONDANSETRON HCL 4 MG/2ML IJ SOLN
INTRAMUSCULAR | Status: AC
  Filled 2017-08-28: qty 2

## 2017-08-28 MED ORDER — HYDROCODONE-ACETAMINOPHEN 5-325 MG PO TABS
ORAL_TABLET | ORAL | Status: AC
  Administered 2017-08-28: 1 via ORAL
  Filled 2017-08-28: qty 1

## 2017-08-28 MED ORDER — FAMOTIDINE 20 MG PO TABS
ORAL_TABLET | ORAL | Status: AC
  Administered 2017-08-28: 20 mg via ORAL
  Filled 2017-08-28: qty 1

## 2017-08-28 MED ORDER — BUPIVACAINE HCL (PF) 0.5 % IJ SOLN
INTRAMUSCULAR | Status: DC | PRN
  Administered 2017-08-28: 10 mL

## 2017-08-28 MED ORDER — FAMOTIDINE 20 MG PO TABS
20.0000 mg | ORAL_TABLET | Freq: Once | ORAL | Status: AC
  Administered 2017-08-28: 20 mg via ORAL

## 2017-08-28 MED ORDER — ONDANSETRON 4 MG PO TBDP: 4.0000 mg | ORAL_TABLET | Freq: Three times a day (TID) | ORAL | 0 refills | Status: DC | PRN

## 2017-08-28 MED ORDER — MIDAZOLAM HCL 2 MG/2ML IJ SOLN
INTRAMUSCULAR | Status: DC | PRN
  Administered 2017-08-28: 2 mg via INTRAVENOUS

## 2017-08-28 MED ORDER — LIDOCAINE-EPINEPHRINE 1 %-1:100000 IJ SOLN
INTRAMUSCULAR | Status: DC | PRN
  Administered 2017-08-28: 10 mL

## 2017-08-28 MED ORDER — HYDROCODONE-ACETAMINOPHEN 5-325 MG PO TABS
1.0000 | ORAL_TABLET | Freq: Once | ORAL | Status: AC
  Administered 2017-08-28: 1 via ORAL

## 2017-08-28 MED ORDER — FENTANYL CITRATE (PF) 100 MCG/2ML IJ SOLN
25.0000 ug | INTRAMUSCULAR | Status: AC | PRN
  Administered 2017-08-28 (×6): 25 ug via INTRAVENOUS

## 2017-08-28 MED ORDER — LIDOCAINE-EPINEPHRINE 1 %-1:100000 IJ SOLN
INTRAMUSCULAR | Status: AC
  Filled 2017-08-28: qty 1

## 2017-08-28 MED ORDER — DEXTROSE 5 % IV SOLN
3.0000 g | Freq: Once | INTRAVENOUS | Status: AC
  Administered 2017-08-28: 3 g via INTRAVENOUS
  Filled 2017-08-28: qty 3000

## 2017-08-28 MED ORDER — PROPOFOL 10 MG/ML IV BOLUS
INTRAVENOUS | Status: AC
  Filled 2017-08-28: qty 20

## 2017-08-28 MED ORDER — BUPIVACAINE HCL (PF) 0.5 % IJ SOLN
INTRAMUSCULAR | Status: AC
Start: 2017-08-28 — End: 2017-08-28
  Filled 2017-08-28: qty 30

## 2017-08-28 MED ORDER — FENTANYL CITRATE (PF) 100 MCG/2ML IJ SOLN
50.0000 ug | Freq: Once | INTRAMUSCULAR | Status: AC
  Administered 2017-08-28: 50 ug via INTRAVENOUS

## 2017-08-28 MED ORDER — IBUPROFEN 800 MG PO TABS: 800.0000 mg | ORAL_TABLET | Freq: Three times a day (TID) | ORAL | 0 refills | Status: AC

## 2017-08-28 MED ORDER — SODIUM CHLORIDE 0.9 % IV SOLN
INTRAVENOUS | Status: DC
  Administered 2017-08-28: 07:00:00 via INTRAVENOUS

## 2017-08-28 MED ORDER — ONDANSETRON HCL 4 MG/2ML IJ SOLN
INTRAMUSCULAR | Status: DC | PRN
  Administered 2017-08-28: 4 mg via INTRAVENOUS

## 2017-08-28 MED ORDER — DEXAMETHASONE SODIUM PHOSPHATE 10 MG/ML IJ SOLN
INTRAMUSCULAR | Status: AC
  Filled 2017-08-28: qty 1

## 2017-08-28 MED ORDER — HYDROCODONE-ACETAMINOPHEN 5-325 MG PO TABS: 1.0000 | ORAL_TABLET | ORAL | 0 refills | Status: DC | PRN

## 2017-08-28 MED ORDER — GLYCOPYRROLATE 0.2 MG/ML IJ SOLN
INTRAMUSCULAR | Status: DC | PRN
  Administered 2017-08-28: 0.2 mg via INTRAVENOUS

## 2017-08-28 MED ORDER — LIDOCAINE HCL (CARDIAC) 20 MG/ML IV SOLN
INTRAVENOUS | Status: DC | PRN
  Administered 2017-08-28: 100 mg via INTRAVENOUS

## 2017-08-28 MED ORDER — ACETAMINOPHEN 10 MG/ML IV SOLN
INTRAVENOUS | Status: DC | PRN
  Administered 2017-08-28: 1000 mg via INTRAVENOUS

## 2017-08-28 MED ORDER — ONDANSETRON HCL 4 MG/2ML IJ SOLN: 4.0000 mg | Freq: Once | INTRAMUSCULAR | Status: DC | PRN

## 2017-08-28 SURGICAL SUPPLY — 41 items
ADAPTER IRRIG TUBE 2 SPIKE SOL (ADAPTER) ×4 IMPLANT
BLADE SURG SZ11 CARB STEEL (BLADE) ×2 IMPLANT
BNDG ESMARK 6X12 TAN STRL LF (GAUZE/BANDAGES/DRESSINGS) IMPLANT
BRUSH SCRUB EZ  4% CHG (MISCELLANEOUS) ×1
BRUSH SCRUB EZ 4% CHG (MISCELLANEOUS) ×1 IMPLANT
BUR RADIUS 3.5 (BURR) ×2 IMPLANT
BUR RADIUS 4.0X18.5 (BURR) IMPLANT
CHLORAPREP W/TINT 26ML (MISCELLANEOUS) ×2 IMPLANT
COOLER POLAR GLACIER W/PUMP (MISCELLANEOUS) ×2 IMPLANT
DRAPE IMP U-DRAPE 54X76 (DRAPES) ×2 IMPLANT
GAUZE SPONGE 4X4 12PLY STRL (GAUZE/BANDAGES/DRESSINGS) ×2 IMPLANT
GLOVE BIOGEL PI IND STRL 8 (GLOVE) ×1 IMPLANT
GLOVE BIOGEL PI INDICATOR 8 (GLOVE) ×1
GLOVE SURG SYN 7.5  E (GLOVE) ×1
GLOVE SURG SYN 7.5 E (GLOVE) ×1 IMPLANT
GOWN STRL REUS W/ TWL LRG LVL3 (GOWN DISPOSABLE) ×1 IMPLANT
GOWN STRL REUS W/ TWL XL LVL3 (GOWN DISPOSABLE) ×1 IMPLANT
GOWN STRL REUS W/TWL LRG LVL3 (GOWN DISPOSABLE) ×1
GOWN STRL REUS W/TWL XL LVL3 (GOWN DISPOSABLE) ×1
IV LACTATED RINGER IRRG 3000ML (IV SOLUTION) ×4
IV LR IRRIG 3000ML ARTHROMATIC (IV SOLUTION) ×4 IMPLANT
KIT RM TURNOVER STRD PROC AR (KITS) ×2 IMPLANT
LABEL OR SOLS (LABEL) ×2 IMPLANT
MANIFOLD NEPTUNE II (INSTRUMENTS) ×2 IMPLANT
MAT BLUE FLOOR 46X72 FLO (MISCELLANEOUS) ×2 IMPLANT
NEEDLE HYPO 22GX1.5 SAFETY (NEEDLE) ×2 IMPLANT
PACK ARTHROSCOPY KNEE (MISCELLANEOUS) ×2 IMPLANT
PAD ABD DERMACEA PRESS 5X9 (GAUZE/BANDAGES/DRESSINGS) ×4 IMPLANT
PAD WRAPON POLAR KNEE (MISCELLANEOUS) ×1 IMPLANT
PADDING CAST 6X4YD NS (MISCELLANEOUS) ×1
PADDING CAST COTTON 6X4 NS (MISCELLANEOUS) ×1 IMPLANT
SET TUBE SUCT SHAVER OUTFL 24K (TUBING) ×2 IMPLANT
SET TUBE TIP INTRA-ARTICULAR (MISCELLANEOUS) ×2 IMPLANT
SLEEVE PROTECTION STRL DISP (MISCELLANEOUS) ×2 IMPLANT
STRIP CLOSURE SKIN 1/2X4 (GAUZE/BANDAGES/DRESSINGS) ×2 IMPLANT
SUT ETHILON 3-0 FS-10 30 BLK (SUTURE) ×2
SUTURE EHLN 3-0 FS-10 30 BLK (SUTURE) ×1 IMPLANT
TOWEL OR 17X26 4PK STRL BLUE (TOWEL DISPOSABLE) ×4 IMPLANT
TUBING ARTHRO INFLOW-ONLY STRL (TUBING) ×2 IMPLANT
WAND HAND CNTRL MULTIVAC 90 (MISCELLANEOUS) ×2 IMPLANT
WRAPON POLAR PAD KNEE (MISCELLANEOUS) ×2

## 2017-08-28 NOTE — Progress Notes (Signed)
Dr Pernell Dupreadams in to check pt   Pt stable

## 2017-08-28 NOTE — Transfer of Care (Signed)
Immediate Anesthesia Transfer of Care Note  Patient: Kyra SearlesMelissa D Avino  Procedure(s) Performed: KNEE ARTHROSCOPY WITH MENISCAL REPAIR, MEDIAL AND LATERAL REPAIR (Left ) CHONDROPLASTY (Left )  Patient Location: PACU  Anesthesia Type:General  Level of Consciousness: sedated  Airway & Oxygen Therapy: Patient Spontanous Breathing and Patient connected to face mask oxygen  Post-op Assessment: Report given to RN and Post -op Vital signs reviewed and stable  Post vital signs: Reviewed and stable  Last Vitals:  Vitals:   08/28/17 0629  BP: (!) 143/78  Pulse: 93  Resp: 20  Temp: 36.8 C  SpO2: 98%    Last Pain:  Vitals:   08/28/17 0629  TempSrc: Tympanic  PainSc: 0-No pain      Patients Stated Pain Goal: 0 (08/28/17 0629)  Complications: No apparent anesthesia complications

## 2017-08-28 NOTE — Anesthesia Preprocedure Evaluation (Signed)
Anesthesia Evaluation  Patient identified by MRN, date of birth, ID band Patient awake    Reviewed: Allergy & Precautions, H&P , NPO status , Patient's Chart, lab work & pertinent test results, reviewed documented beta blocker date and time   Airway Mallampati: II  TM Distance: >3 FB Neck ROM: full    Dental  (+) Teeth Intact   Pulmonary neg pulmonary ROS, sleep apnea ,    Pulmonary exam normal        Cardiovascular Exercise Tolerance: Good hypertension, On Medications negative cardio ROS Normal cardiovascular exam Rhythm:regular Rate:Normal     Neuro/Psych negative neurological ROS  negative psych ROS   GI/Hepatic negative GI ROS, Neg liver ROS,   Endo/Other  negative endocrine ROSdiabetesHypothyroidism Morbid obesity  Renal/GU negative Renal ROS  negative genitourinary   Musculoskeletal   Abdominal   Peds  Hematology negative hematology ROS (+)   Anesthesia Other Findings Past Medical History: No date: Diabetes mellitus without complication (HCC) No date: HBP (high blood pressure)     Comment:  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 No date: Hypothyroidism No date: Sinus congestion No date: Sleep apnea     Comment:  cpap No date: Thyroid disorder Past Surgical History: 2007: ABDOMINAL HYSTERECTOMY 2007: CESAREAN SECTION 2008: MOUTH SURGERY     Comment:  4 wisdom teeth pulled 2007: PARTIAL HYSTERECTOMY 2007: TUBAL LIGATION BMI    Body Mass Index:  48.64 kg/m     Reproductive/Obstetrics negative OB ROS                             Anesthesia Physical Anesthesia Plan  ASA: III  Anesthesia Plan: General ETT   Post-op Pain Management:    Induction:   PONV Risk Score and Plan: 4 or greater  Airway Management Planned:   Additional Equipment:   Intra-op Plan:   Post-operative Plan:   Informed Consent: I have reviewed the patients History and Physical, chart, labs and  discussed the procedure including the risks, benefits and alternatives for the proposed anesthesia with the patient or authorized representative who has indicated his/her understanding and acceptance.   Dental Advisory Given  Plan Discussed with: CRNA  Anesthesia Plan Comments:         Anesthesia Quick Evaluation

## 2017-08-28 NOTE — Anesthesia Post-op Follow-up Note (Signed)
Anesthesia QCDR form completed.        

## 2017-08-28 NOTE — Progress Notes (Signed)
States difficult taking deep breath   Pt has sleep apnea and was difficult intubation  No resp distress lungs clear  Sat on room air 97

## 2017-08-28 NOTE — H&P (Signed)
Paper H&P to be scanned into permanent record. H&P reviewed. No significant changes noted.  

## 2017-08-28 NOTE — Progress Notes (Signed)
Applied polar care  

## 2017-08-28 NOTE — Anesthesia Procedure Notes (Signed)
Procedure Name: Intubation Date/Time: 08/28/2017 7:55 AM Performed by: Ginger CarneMichelet, Saquan Furtick, CRNA Pre-anesthesia Checklist: Patient identified, Emergency Drugs available, Suction available, Patient being monitored and Timeout performed Patient Re-evaluated:Patient Re-evaluated prior to induction Oxygen Delivery Method: Circle system utilized Preoxygenation: Pre-oxygenation with 100% oxygen Induction Type: IV induction Ventilation: Two handed mask ventilation required, Mask ventilation with difficulty and Oral airway inserted - appropriate to patient size Laryngoscope Size: McGraph, 4 and 3 Grade View: Grade III Tube type: Oral Tube size: 7.0 mm Number of attempts: 3 Airway Equipment and Method: Stylet,  Video-laryngoscopy and Bougie stylet Placement Confirmation: ETT inserted through vocal cords under direct vision,  positive ETCO2 and breath sounds checked- equal and bilateral Secured at: 22 cm Tube secured with: Tape Dental Injury: Teeth and Oropharynx as per pre-operative assessment and Injury to lip  Difficulty Due To: Difficulty was anticipated, Difficult Airway- due to large tongue, Difficult Airway- due to reduced neck mobility, Difficult Airway- due to anterior larynx and Difficult Airway- due to limited oral opening Future Recommendations: Recommend- induction with short-acting agent, and alternative techniques readily available

## 2017-08-28 NOTE — Discharge Instructions (Signed)
AMBULATORY SURGERY  DISCHARGE INSTRUCTIONS   1) The drugs that you were given will stay in your system until tomorrow so for the next 24 hours you should not:  A) Drive an automobile B) Make any legal decisions C) Drink any alcoholic beverage   2) You may resume regular meals tomorrow.  Today it is better to start with liquids and gradually work up to solid foods.  You may eat anything you prefer, but it is better to start with liquids, then soup and crackers, and gradually work up to solid foods.   3) Please notify your doctor immediately if you have any unusual bleeding, trouble breathing, redness and pain at the surgery site, drainage, fever, or pain not relieved by medication.    4) Additional Instructions:        Please contact your physician with any problems or Same Day Surgery at [phone], Monday through Friday 6 am to 4 pm, or [place] at [facility] Main number at [phone].Arthroscopic Knee Surgery - Partial Meniscectomy  Post-Op Instructions  1. Bracing or crutches: Crutches will be provided at the time of discharge from the surgery center if you do not already have them.  2. Ice: You may be provided with a device [facility](Polar Care) that allows you to ice the affected area effectively. Otherwise you can ice manually.   3. Driving:  Plan on not driving for at least two weeks. Please note that you are advised NOT to drive while taking narcotic pain medications as you may be impaired and unsafe to drive.  4. Activity: Ankle pumps several times an hour while awake to prevent blood clots. Weight bearing: as tolerated. Use crutches for as needed (usually ~1 week or less) until pain allows you to ambulate without a limp. Bending and straightening the knee is unlimited. Elevate knee above heart level as much as possible for one week. Avoid standing more than 5 minutes (consecutively) for the first week.  Avoid long distance travel for 2 weeks.  5. Medications:  -  You have been provided a prescription for narcotic pain medicine. After surgery, take 1-2 narcotic tablets every 4 hours if needed for severe pain.  - A prescription for anti-nausea medication will be provided in case the narcotic medicine causes nausea - take 1 tablet every 6 hours only if nauseated.  - Take ibuprofen 800 mg every 8 hours WITH food to reduce post-operative knee swelling. DO NOT STOP IBUPROFEN POST-OP UNTIL INSTRUCTED TO DO SO at first post-op office visit (10-14 days after surgery). However, please discontinue if you have any abdominal discomfort after taking this.  - Take enteric coated aspirin 325 mg once daily for 2 weeks to prevent blood clots.    6. Bandages: The physical therapist should change the bandages at the first post-op appointment. If needed, the dressing supplies have been provided to you.  7. Physical Therapy: 1-2 times per week for 6 weeks. Therapy typically starts on post operative Day 3 or 4. You have been provided an order for physical therapy. The therapist will provide home exercises.  8. Work: May return to full work usually around 2 weeks after 1st post-operative visit. May do light duty/desk job in approximately 1-2 weeks when off of narcotics, pain is well-controlled, and swelling has decreased.  9. Post-Op Appointments: Your first post-op appointment will be with Dr. Allena KatzPatel in approximately 2 weeks time.   If you find that they have not been scheduled please call the Orthopaedic Appointment front desk at [phone].

## 2017-08-28 NOTE — Progress Notes (Signed)
Pt continues to have rib discomfort and states she has a hard time breathing   Dr Pernell Dupreadams to come talk to pt

## 2017-08-28 NOTE — Op Note (Signed)
Operative Note   SURGERY DATE: 08/28/2017  PRE-OP DIAGNOSIS:  1. Left lateral meniscus tear 2. Left medial meniscus tear 3. Left knee osteoarthritis  POST-OP DIAGNOSIS: 1. Left lateral meniscus tear 2. Left knee loose bodies 3. Left knee osteoarthritis 4. Left knee medial plica irritation  PROCEDURES:  1. Left knee arthroscopy, partial lateral meniscectomy 2. Left chondroplasty of patellofemoral and lateral compartments 3. Left arthroscopic removal of loose bodies 4. Left excision of medial plica  SURGEON: Rosealee AlbeeSunny H. Chaniqua Brisby, MD  ANESTHESIA: Gen  ESTIMATED BLOOD LOSS:minimal  TOTAL IV FLUIDS: per anesthesia  INDICATION(S):  Karen Wiley is a 46 y.o. female who initially injured her knee ~5 months ago after a fall from a swing. Pain is mostly on lateral aspect of knee. MRI shows medial and lateral meniscus tears. She feels sensations of catching, especially with twisting motions of her knee and pain is located along the lateral joint line. After discussion of risks, benefits, and alternatives to surgery, the patient elected to proceed.    OPERATIVE FINDINGS:   Examination under anesthesia:A careful examination under anesthesia was performed. Passive range of motion was: Hyperextension: 2. Extension: 0. Flexion: 120. Lachman: normal. Pivot Shift: normal. Posterior drawer: normal. Varus stability in full extension: normal. Varus stability in 30 degrees of flexion: normal. Valgus stability in full extension: normal. Valgus stability in 30 degrees of flexion: normal.  Intra-operative findings:A thorough arthroscopic examination of the knee was performed. The findings are: 1. Suprapatellar pouch: Normal 2. Undersurface of median ridge: Normal 3. Medial patellar facet: Grade 2-3 degenerative changes 4. Lateral patellar facet: Grade 3-4 degenerative changes 5. Trochlea: Grade 1-2 degenerative changes 6. Lateral gutter/popliteus tendon: Normal 7. Hoffa's  fat pad: Inflamed 8. Medial gutter/plica: Enlarged medial plica and loose body in medial gutter 9. ACL: Normal 10. PCL: Normal 11. Medial meniscus: normal 12. Medial compartment cartilage: Grade 1 degenerative changes to femoral condyle 13. Lateral meniscus: Undersurface tearing of posterior horn of lateral meniscus 14. Lateral compartment cartilage: Large area ~2x3cm of grade 4 changes on femoral condyle, other areas of grade 2-3 changes of femoral condyle, Grade 2-3 changes of tibial plateau  OPERATIVE REPORT:   I identified Karen D Wilsonin the pre-operative holding area. I marked theoperativeknee with my initials. I reviewed the risks and benefits of the proposed surgical intervention and the patient (and/or patient's guardian) wished to proceed. The patient was transferred to the operative suite and placed in the supine position with all bony prominences padded. Anesthesia was administered. Appropriate IV antibioticswere administered within 30 minutes before incision. The extremity was then prepped and draped in standard fashion. A time out was performed confirming the correct extremity, correct patient, and correct procedure.  Arthroscopy portals were marked. Local anesthetic was injected to the planned portal sites. The anterolateral portalwasestablished with an 11blade. The arthroscope was placed in the anterolateral portal and theninto the suprapatellar pouch. A diagnostic knee scope was completed with the above findings.  Next the medial portal was established under needle localization. Tourniquet was inflated at this point due to lack of visualization, and remained active for 41 minutes. The lateral meniscal tear was debrided using an oscillating shaver until the meniscus had stable borders. The hypertrophic and synovitic fat pat was debrided. The medial plica was debrided with an oscillating shaver. A chondroplasty was performed of the lateral compartment and  patellofemoral compartment such that there were stable cartilage edges without any loose fragments of cartilage. There were multiple loose bodies (cartilage pieces) present, and those were  removed from the joint with an oscillating shaver. Arthroscopic fluid was removed from the joint.  The portals were closed with 3-0 Nylon suture. Sterile dressings included Xeroform, 4x4s, Sof-Rol, and Bias wrap. A Polarcare was placed.  The patient was then awakened and taken to the PACU hemodynamically stable without complication.   POSTOPERATIVE PLAN: The patient will be discharged home today once they meet PACU criteria. Aspirin 325 mg daily was prescribed for 2 weeks for DVT prophylaxis. Physical therapy will start on POD#3-4.Weight-bearing as tolerated. They will follow up in 2 weeks per protocol.

## 2017-08-29 ENCOUNTER — Ambulatory Visit: Admit: 2017-08-29 | Payer: BLUE CROSS/BLUE SHIELD | Admitting: Orthopedic Surgery

## 2017-08-29 SURGERY — ARTHROSCOPY, KNEE, WITH MEDIAL MENISCECTOMY
Anesthesia: Choice | Laterality: Left

## 2017-09-04 NOTE — Anesthesia Postprocedure Evaluation (Signed)
Anesthesia Post Note  Patient: Karen SearlesMelissa D Wiley  Procedure(s) Performed: KNEE ARTHROSCOPY WITH MENISCAL REPAIR, MEDIAL AND LATERAL REPAIR (Left ) CHONDROPLASTY (Left )  Patient location during evaluation: PACU Anesthesia Type: General Level of consciousness: awake and alert Pain management: pain level controlled Vital Signs Assessment: post-procedure vital signs reviewed and stable Respiratory status: spontaneous breathing, nonlabored ventilation, respiratory function stable and patient connected to nasal cannula oxygen Cardiovascular status: blood pressure returned to baseline and stable Postop Assessment: no apparent nausea or vomiting Anesthetic complications: no     Last Vitals:  Vitals:   08/28/17 1116 08/28/17 1204  BP: (!) 144/81 135/81  Pulse: 80 88  Resp: 16 16  Temp: 37 C   SpO2: 94% 95%    Last Pain:  Vitals:   08/30/17 1015  TempSrc:   PainSc: 1                  Yevette EdwardsJames G Adams

## 2018-02-06 ENCOUNTER — Other Ambulatory Visit: Payer: Self-pay | Admitting: Family Medicine

## 2018-02-06 DIAGNOSIS — Z1231 Encounter for screening mammogram for malignant neoplasm of breast: Secondary | ICD-10-CM

## 2018-02-22 ENCOUNTER — Ambulatory Visit
Admission: RE | Admit: 2018-02-22 | Discharge: 2018-02-22 | Disposition: A | Discharge status: Home / Self Care | Payer: BLUE CROSS/BLUE SHIELD | Source: Ambulatory Visit | Attending: Family Medicine | Admitting: Family Medicine

## 2018-02-22 DIAGNOSIS — Z1231 Encounter for screening mammogram for malignant neoplasm of breast: Secondary | ICD-10-CM | POA: Diagnosis not present

## 2018-07-08 IMAGING — MG MM DIGITAL SCREENING BILAT W/ TOMO W/ CAD
8 of 12 series · 8 of 28 positions shown · non-contrast
Comparison: Previous exam(s).

CLINICAL DATA: Screening.

EXAM:
2D DIGITAL SCREENING BILATERAL MAMMOGRAM WITH CAD AND ADJUNCT TOMO

[L MLO synth-2D]
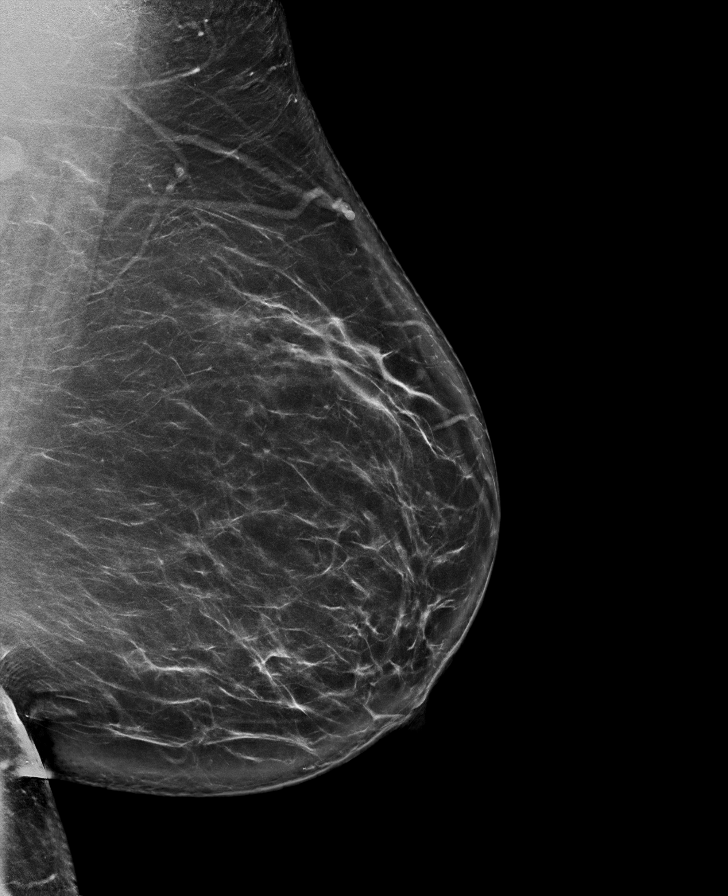

[R MLO]
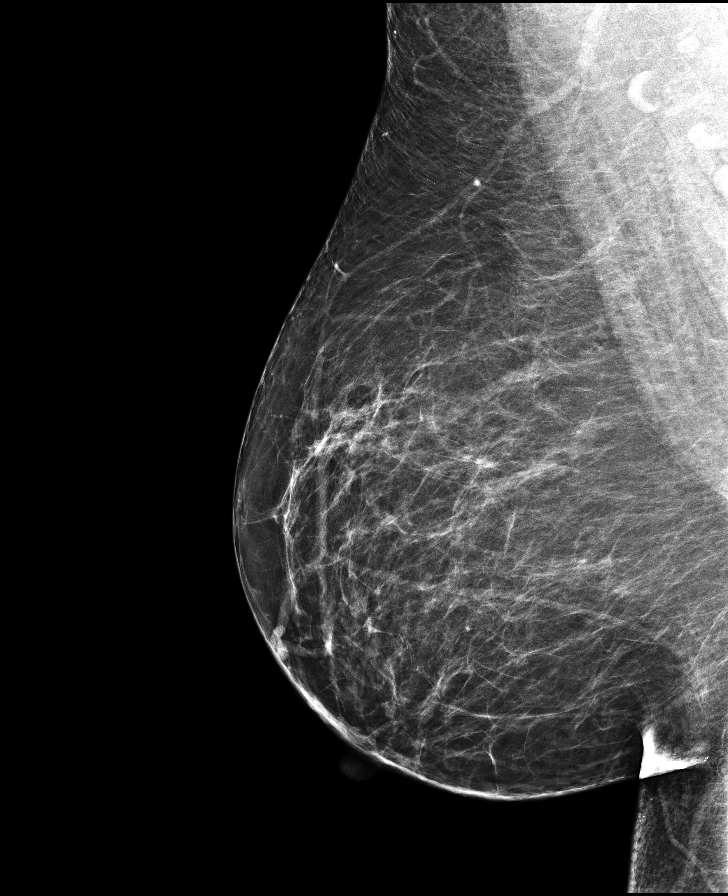

[R MLO synth-2D]
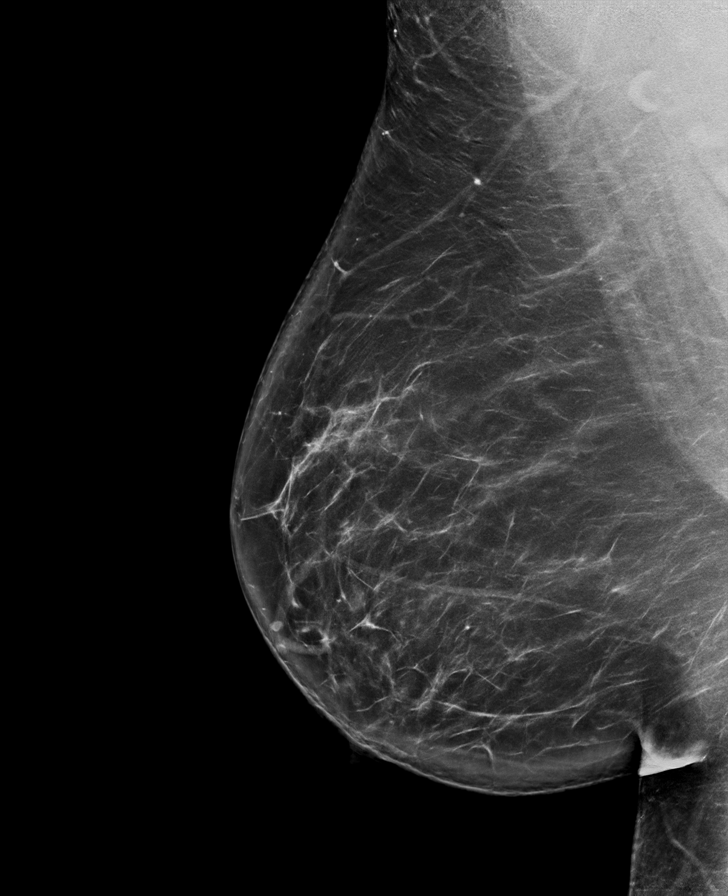

[R CC]
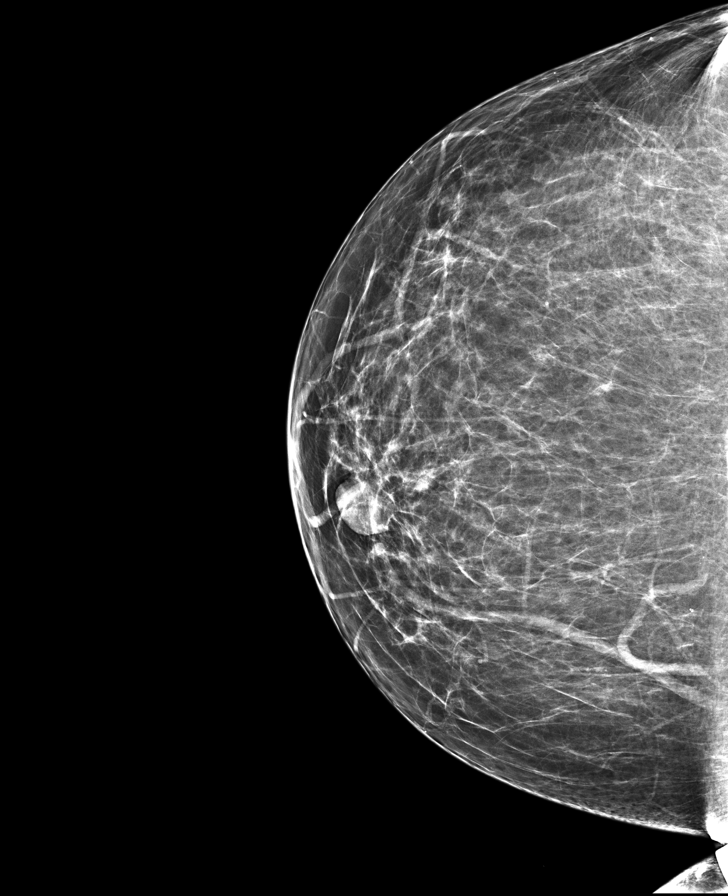

[R CC synth-2D]
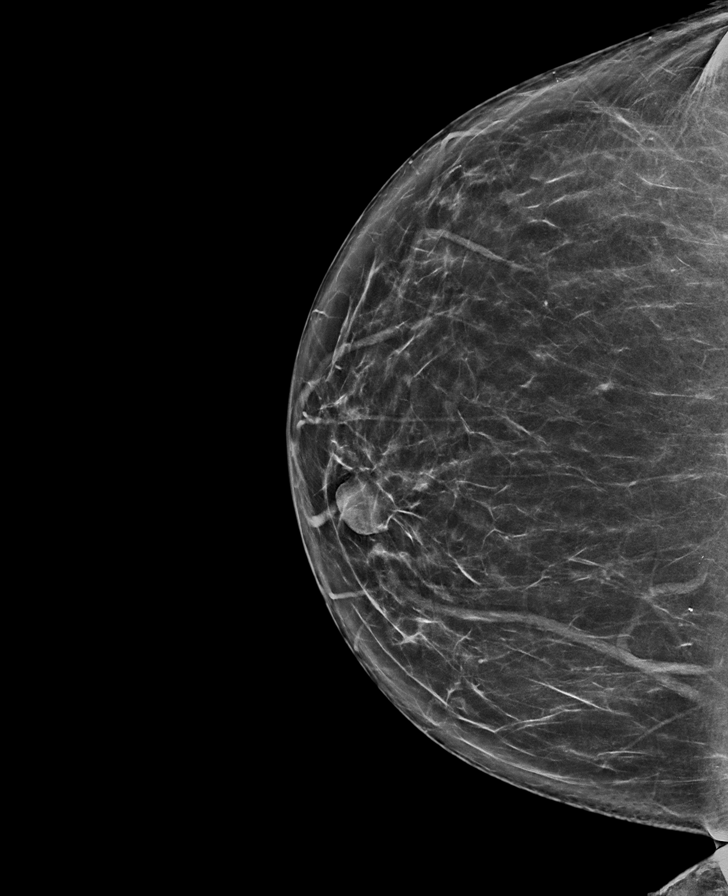

[L CC]
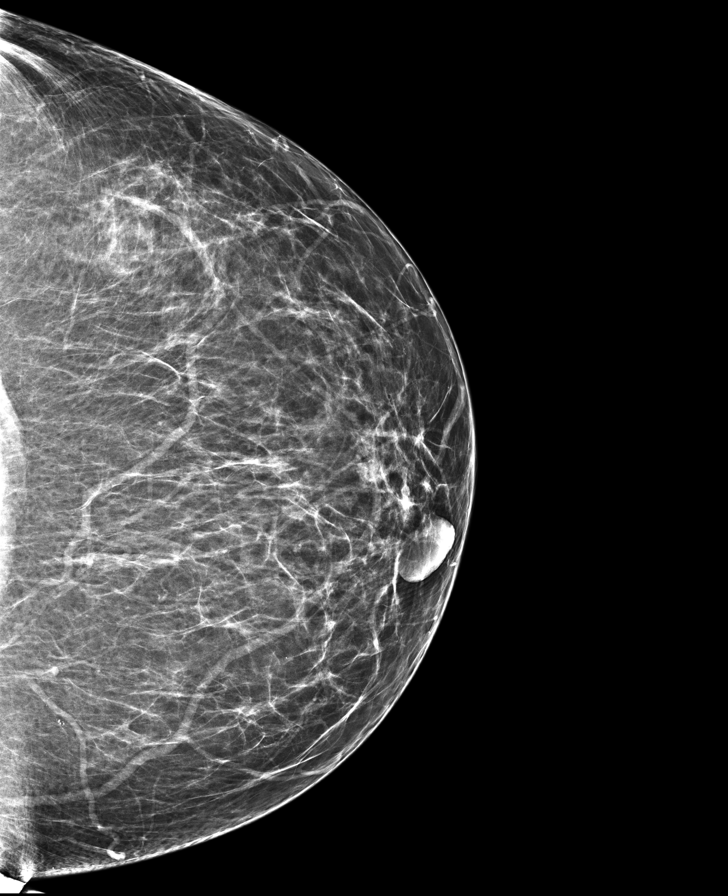

[L MLO]
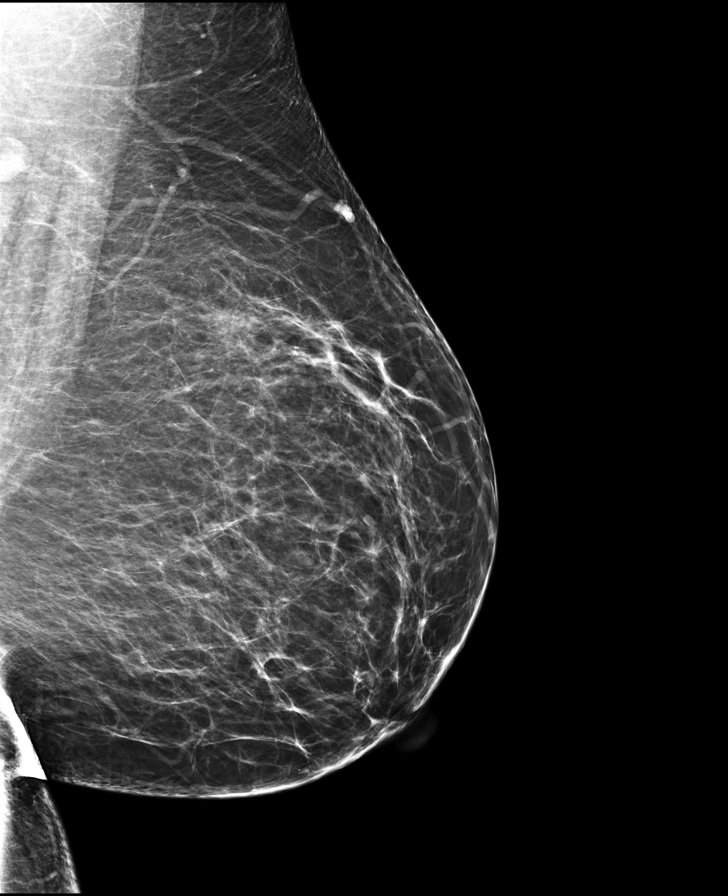

[L CC synth-2D]
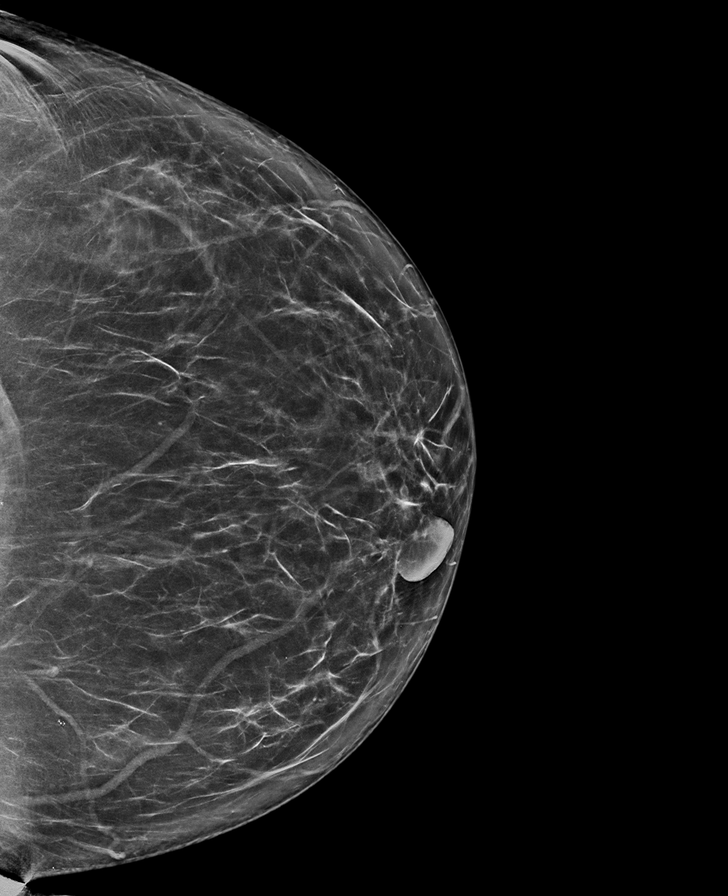

[8 of 28 positions shown; findings below may reference images not displayed]

ACR Breast Density Category b: There are scattered areas of
fibroglandular density.
FINDINGS: There are no findings suspicious for malignancy. Images were
processed with CAD.
IMPRESSION: No mammographic evidence of malignancy. A result letter of this
screening mammogram will be mailed directly to the patient.

RECOMMENDATION:
Screening mammogram in one year. (Code:97-6-RS4)

BI-RADS CATEGORY  1: Negative.

## 2019-02-02 IMAGING — MR MR KNEE*L* W/O CM
6 series · 36 of 40 positions shown · non-contrast
Comparison: None.

CLINICAL DATA: Status post fall March 2017. Distal lateral left knee
pain.

EXAM:
MRI OF THE LEFT KNEE WITHOUT CONTRAST
TECHNIQUE: Multiplanar, multisequence MR imaging of the knee was performed. No
intravenous contrast was administered.

[Series 3: PD fat-sat · axial · 3.0mm · 0.53mm/px · z∈[-75,+50]mm · 8 of 39 slices shown (1 of 4)]
[im 1/39]
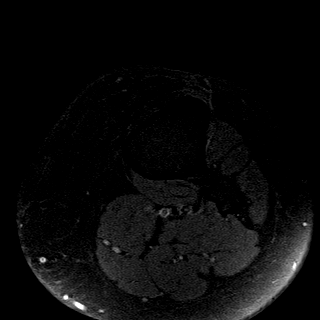
[im 6/39]
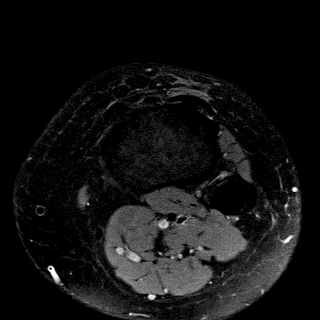
[im 11/39]
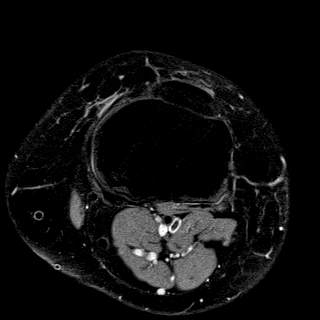
[im 17/39]
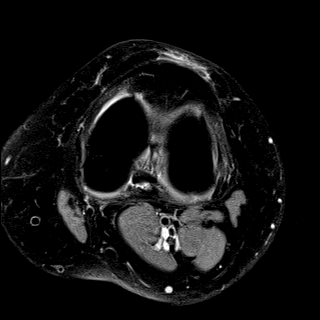
[im 22/39]
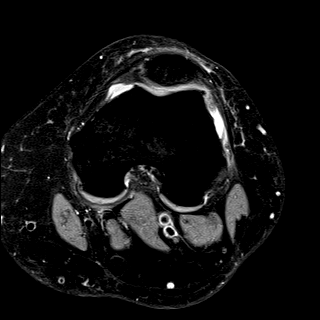
[im 28/39]
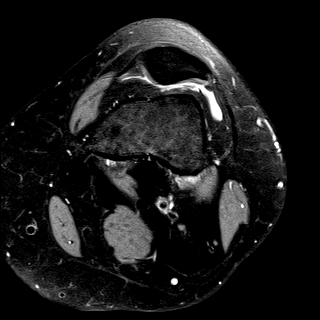
[im 33/39]
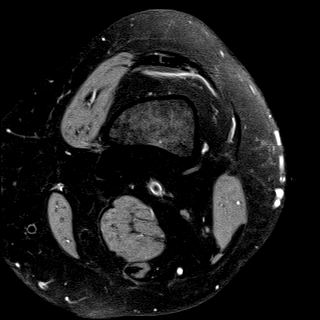
[im 39/39]
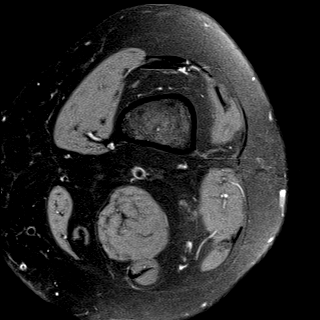

[Series 4: T1 · coronal · 3.0mm · 0.50mm/px · 3 of 35 slices shown]
[im 1/35]
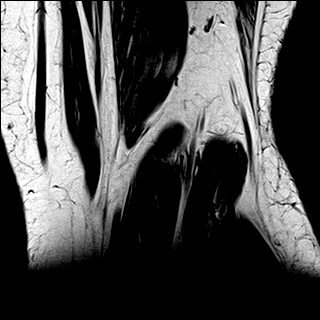
[im 6/35]
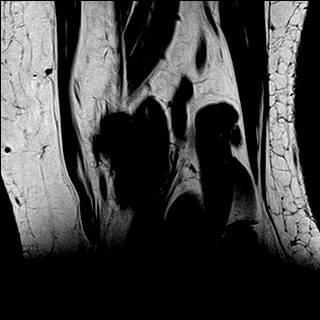
[im 12/35]
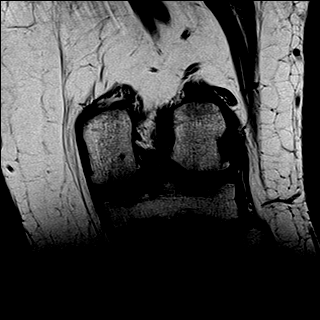

[Series 5: PD fat-sat · sagittal · 3.0mm · 0.50mm/px · 7 of 33 slices shown (2 of 4)]
[im 1/33]
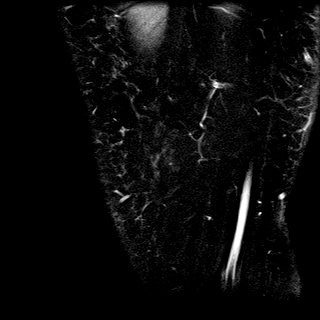
[im 6/33]
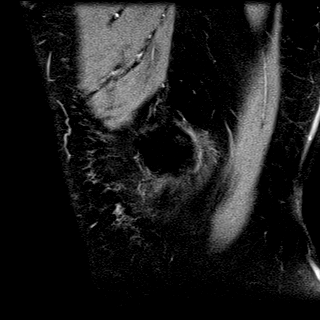
[im 11/33]
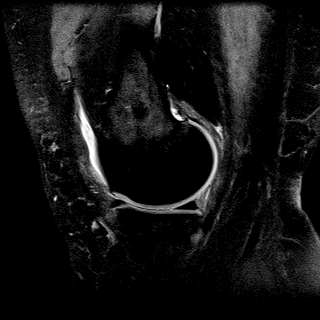
[im 17/33]
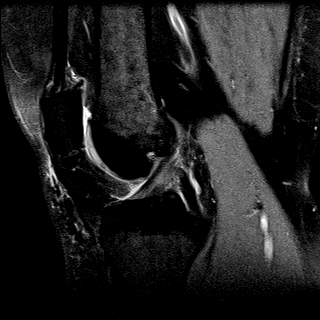
[im 22/33]
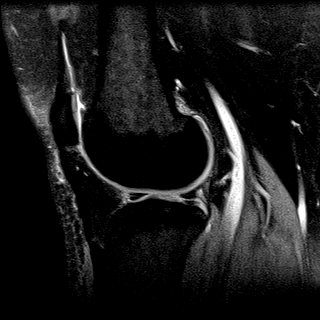
[im 27/33]
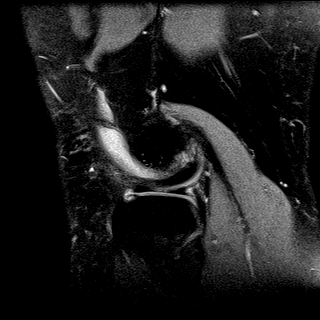
[im 33/33]
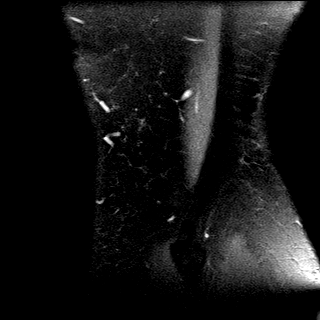

[Series 6: T2 fat-sat · coronal · 3.0mm · 0.31mm/px · 7 of 35 slices shown]
[im 1/35]
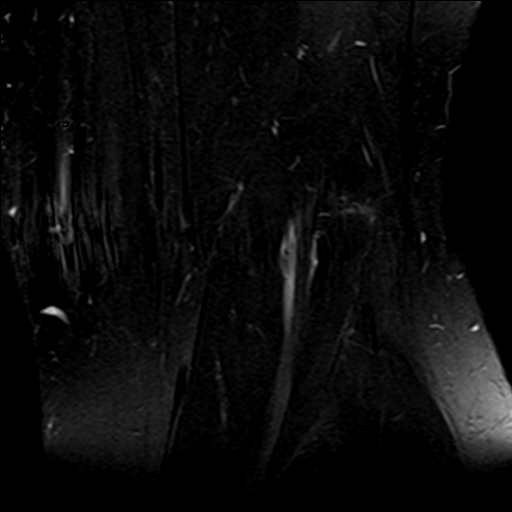
[im 6/35]
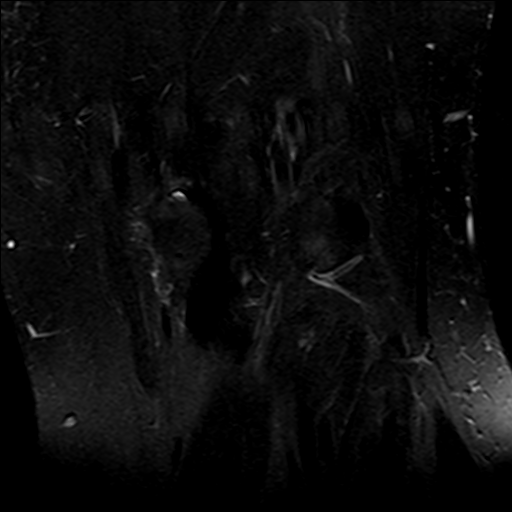
[im 12/35]
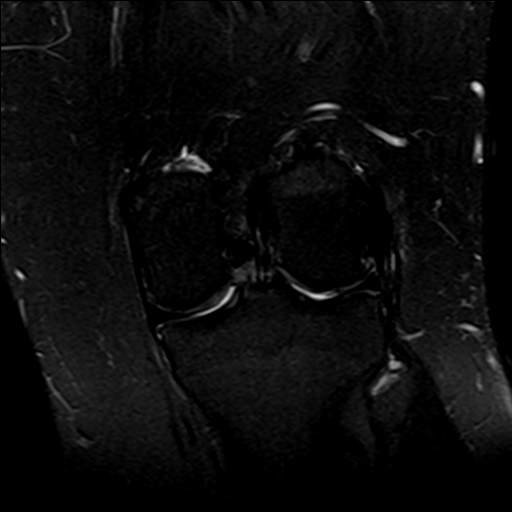
[im 18/35]
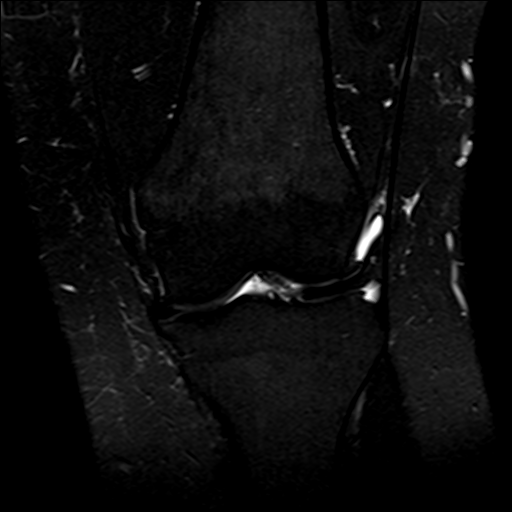
[im 23/35]
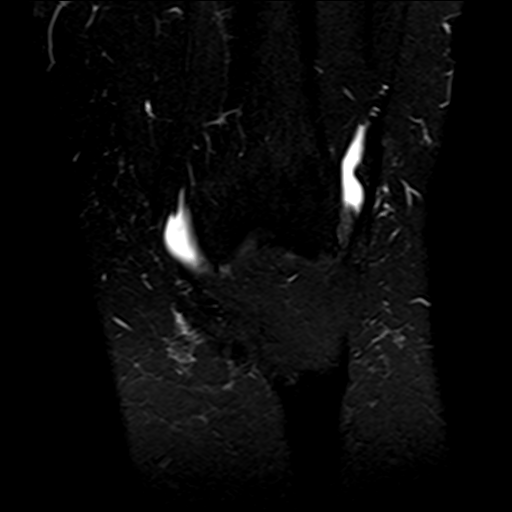
[im 29/35]
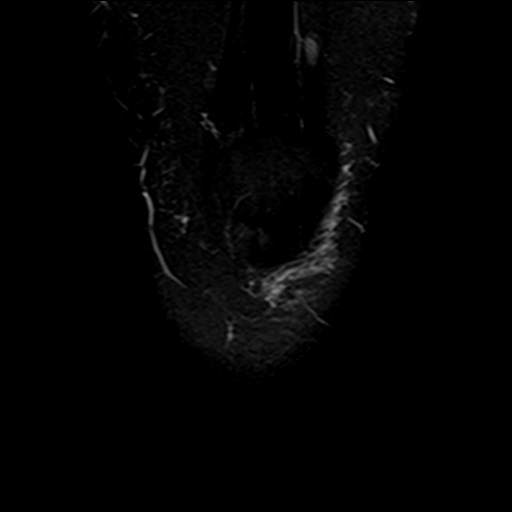
[im 35/35]
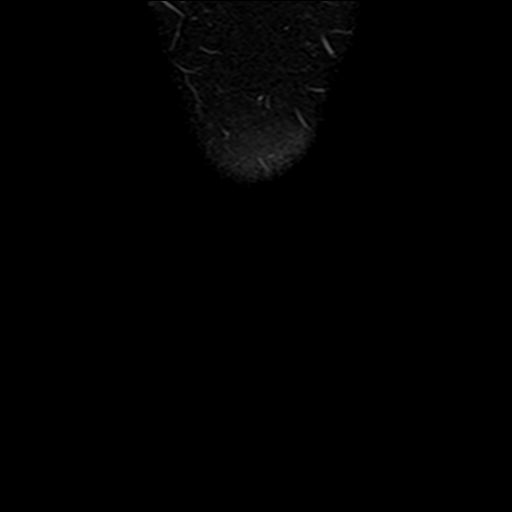

[Series 7: PD fat-sat · coronal · 3.0mm · 0.50mm/px · 7 of 35 slices shown (3 of 4)]
[im 1/35]
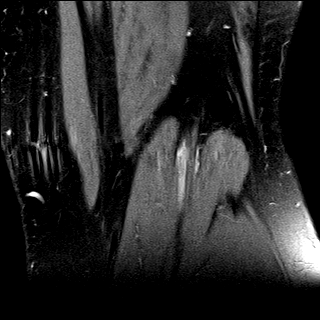
[im 6/35]
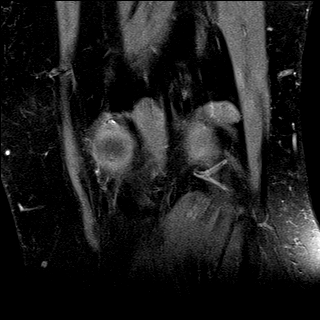
[im 12/35]
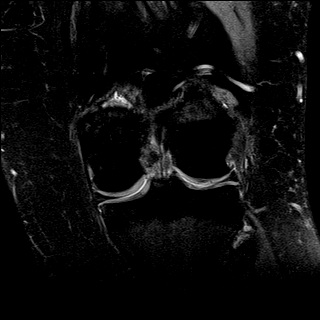
[im 18/35]
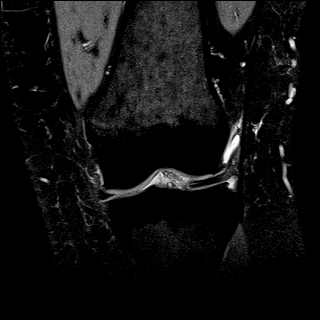
[im 23/35]
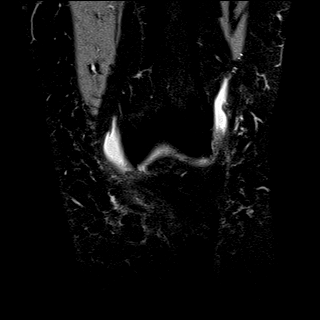
[im 29/35]
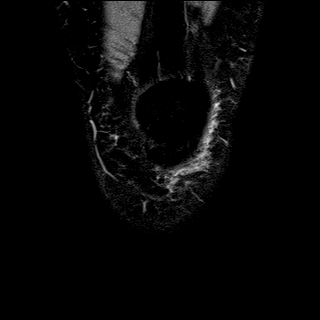
[im 35/35]
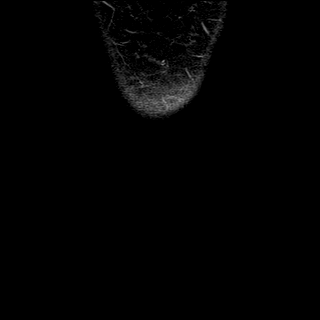

[Series 8: PD fat-sat · oblique · 2.0mm · 0.62mm/px · 4 of 17 slices shown (4 of 4)]
[im 1/17]
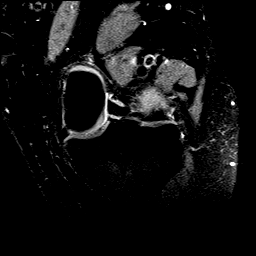
[im 6/17]
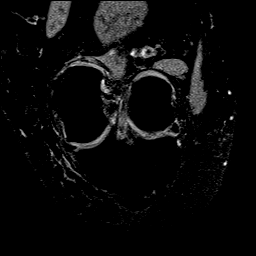
[im 11/17]
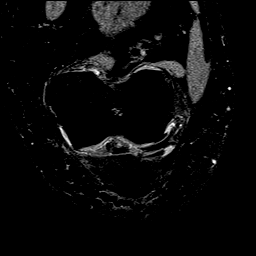
[im 17/17]
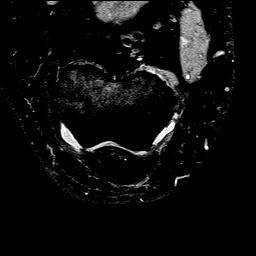

[36 of 40 positions shown; findings below may reference images not displayed]

FINDINGS: MENISCI

Medial meniscus: Small oblique tear of the posterior horn of the
medial meniscus extending to the inferior articular surface.

Lateral meniscus: Small radial tear of the free edge of the body of
the lateral meniscus.

LIGAMENTS

Cruciates:  Intact ACL and PCL.

Collaterals: Medial collateral ligament is intact. Lateral
collateral ligament complex is intact.

CARTILAGE

Patellofemoral: Full-thickness cartilage loss of the lateral
patellar facet and patellar apex. High-grade partial-thickness
cartilage loss of the trochlea.

Medial: Mild partial-thickness cartilage loss of the medial
femorotibial compartment. Small marginal osteophytes.

Lateral: Partial-thickness cartilage loss of the lateral femoral
condyle and lateral tibial plateau with a small area of high-grade
partial-thickness cartilage loss along the weight-bearing surface.
Small marginal osteophytes.

Joint: No joint effusion. Normal Hoffa's fat. No plical thickening.

Popliteal Fossa:  No Baker's cyst.  Intact popliteus tendon.

Extensor Mechanism: In Intact quadriceps tendon and patellar tendon.
Intact medial and lateral patellar retinaculum. Intact MPFL.

Bones:  No acute osseous abnormality.  No aggressive osseous lesion.

Other: No fluid collection or hematoma.
IMPRESSION: 1. Small oblique tear of the posterior horn of the medial meniscus
extending to the inferior articular surface.
2. Small radial tear of the free edge of the body of the lateral
meniscus.
3. Tricompartmental cartilage abnormalities as described above.

## 2019-02-27 ENCOUNTER — Other Ambulatory Visit: Payer: Self-pay

## 2019-02-27 ENCOUNTER — Encounter (INDEPENDENT_AMBULATORY_CARE_PROVIDER_SITE_OTHER): Payer: BLUE CROSS/BLUE SHIELD | Admitting: Ophthalmology

## 2019-02-27 DIAGNOSIS — H35033 Hypertensive retinopathy, bilateral: Secondary | ICD-10-CM | POA: Diagnosis not present

## 2019-02-27 DIAGNOSIS — H318 Other specified disorders of choroid: Secondary | ICD-10-CM | POA: Diagnosis not present

## 2019-02-27 DIAGNOSIS — I10 Essential (primary) hypertension: Secondary | ICD-10-CM | POA: Diagnosis not present

## 2019-02-27 DIAGNOSIS — H43813 Vitreous degeneration, bilateral: Secondary | ICD-10-CM

## 2019-03-27 ENCOUNTER — Other Ambulatory Visit: Payer: Self-pay

## 2019-03-27 ENCOUNTER — Encounter (INDEPENDENT_AMBULATORY_CARE_PROVIDER_SITE_OTHER): Payer: BLUE CROSS/BLUE SHIELD | Admitting: Ophthalmology

## 2019-03-27 DIAGNOSIS — H353231 Exudative age-related macular degeneration, bilateral, with active choroidal neovascularization: Secondary | ICD-10-CM | POA: Diagnosis not present

## 2019-03-27 DIAGNOSIS — H43813 Vitreous degeneration, bilateral: Secondary | ICD-10-CM

## 2019-03-27 DIAGNOSIS — H35033 Hypertensive retinopathy, bilateral: Secondary | ICD-10-CM | POA: Diagnosis not present

## 2019-03-27 DIAGNOSIS — I10 Essential (primary) hypertension: Secondary | ICD-10-CM | POA: Diagnosis not present

## 2019-04-24 ENCOUNTER — Encounter (INDEPENDENT_AMBULATORY_CARE_PROVIDER_SITE_OTHER): Payer: BC Managed Care – PPO | Admitting: Ophthalmology

## 2019-04-24 ENCOUNTER — Other Ambulatory Visit: Payer: Self-pay

## 2019-04-24 DIAGNOSIS — H2512 Age-related nuclear cataract, left eye: Secondary | ICD-10-CM

## 2019-04-24 DIAGNOSIS — I10 Essential (primary) hypertension: Secondary | ICD-10-CM

## 2019-04-24 DIAGNOSIS — H43813 Vitreous degeneration, bilateral: Secondary | ICD-10-CM | POA: Diagnosis not present

## 2019-04-24 DIAGNOSIS — H318 Other specified disorders of choroid: Secondary | ICD-10-CM | POA: Diagnosis not present

## 2019-04-24 DIAGNOSIS — H35033 Hypertensive retinopathy, bilateral: Secondary | ICD-10-CM | POA: Diagnosis not present

## 2019-05-22 ENCOUNTER — Encounter (INDEPENDENT_AMBULATORY_CARE_PROVIDER_SITE_OTHER): Payer: BC Managed Care – PPO | Admitting: Ophthalmology

## 2019-05-22 ENCOUNTER — Other Ambulatory Visit: Payer: Self-pay

## 2019-05-22 DIAGNOSIS — H35033 Hypertensive retinopathy, bilateral: Secondary | ICD-10-CM | POA: Diagnosis not present

## 2019-05-22 DIAGNOSIS — H318 Other specified disorders of choroid: Secondary | ICD-10-CM | POA: Diagnosis not present

## 2019-05-22 DIAGNOSIS — H2513 Age-related nuclear cataract, bilateral: Secondary | ICD-10-CM

## 2019-05-22 DIAGNOSIS — H43813 Vitreous degeneration, bilateral: Secondary | ICD-10-CM

## 2019-05-22 DIAGNOSIS — I10 Essential (primary) hypertension: Secondary | ICD-10-CM | POA: Diagnosis not present

## 2019-06-19 ENCOUNTER — Other Ambulatory Visit: Payer: Self-pay

## 2019-06-19 ENCOUNTER — Encounter (INDEPENDENT_AMBULATORY_CARE_PROVIDER_SITE_OTHER): Payer: BC Managed Care – PPO | Admitting: Ophthalmology

## 2019-06-19 DIAGNOSIS — H2513 Age-related nuclear cataract, bilateral: Secondary | ICD-10-CM

## 2019-06-19 DIAGNOSIS — H43813 Vitreous degeneration, bilateral: Secondary | ICD-10-CM | POA: Diagnosis not present

## 2019-06-19 DIAGNOSIS — I10 Essential (primary) hypertension: Secondary | ICD-10-CM | POA: Diagnosis not present

## 2019-06-19 DIAGNOSIS — H35033 Hypertensive retinopathy, bilateral: Secondary | ICD-10-CM | POA: Diagnosis not present

## 2019-06-19 DIAGNOSIS — H318 Other specified disorders of choroid: Secondary | ICD-10-CM

## 2019-07-17 ENCOUNTER — Other Ambulatory Visit: Payer: Self-pay

## 2019-07-17 ENCOUNTER — Encounter (INDEPENDENT_AMBULATORY_CARE_PROVIDER_SITE_OTHER): Payer: BC Managed Care – PPO | Admitting: Ophthalmology

## 2019-07-17 DIAGNOSIS — H318 Other specified disorders of choroid: Secondary | ICD-10-CM

## 2019-07-17 DIAGNOSIS — H35033 Hypertensive retinopathy, bilateral: Secondary | ICD-10-CM | POA: Diagnosis not present

## 2019-07-17 DIAGNOSIS — I10 Essential (primary) hypertension: Secondary | ICD-10-CM | POA: Diagnosis not present

## 2019-07-17 DIAGNOSIS — H43813 Vitreous degeneration, bilateral: Secondary | ICD-10-CM

## 2019-08-15 ENCOUNTER — Encounter (INDEPENDENT_AMBULATORY_CARE_PROVIDER_SITE_OTHER): Payer: BC Managed Care – PPO | Admitting: Ophthalmology

## 2019-08-15 DIAGNOSIS — H35033 Hypertensive retinopathy, bilateral: Secondary | ICD-10-CM

## 2019-08-15 DIAGNOSIS — H43813 Vitreous degeneration, bilateral: Secondary | ICD-10-CM | POA: Diagnosis not present

## 2019-08-15 DIAGNOSIS — I10 Essential (primary) hypertension: Secondary | ICD-10-CM | POA: Diagnosis not present

## 2019-08-15 DIAGNOSIS — H353231 Exudative age-related macular degeneration, bilateral, with active choroidal neovascularization: Secondary | ICD-10-CM | POA: Diagnosis not present

## 2019-08-24 IMAGING — MG MM DIGITAL SCREENING BILAT W/ TOMO W/ CAD
8 series · 8 of 24 positions shown · non-contrast
Comparison: Previous exam(s).

CLINICAL DATA: Screening.

EXAM:
DIGITAL SCREENING BILATERAL MAMMOGRAM WITH TOMO AND CAD

[R CC synth-2D]
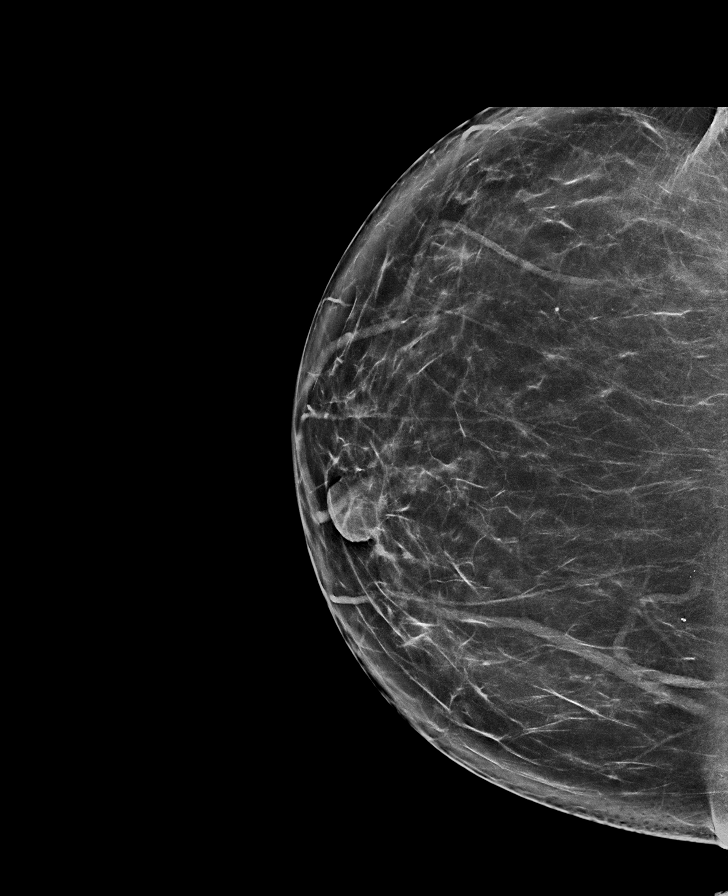

[L MLO synth-2D]
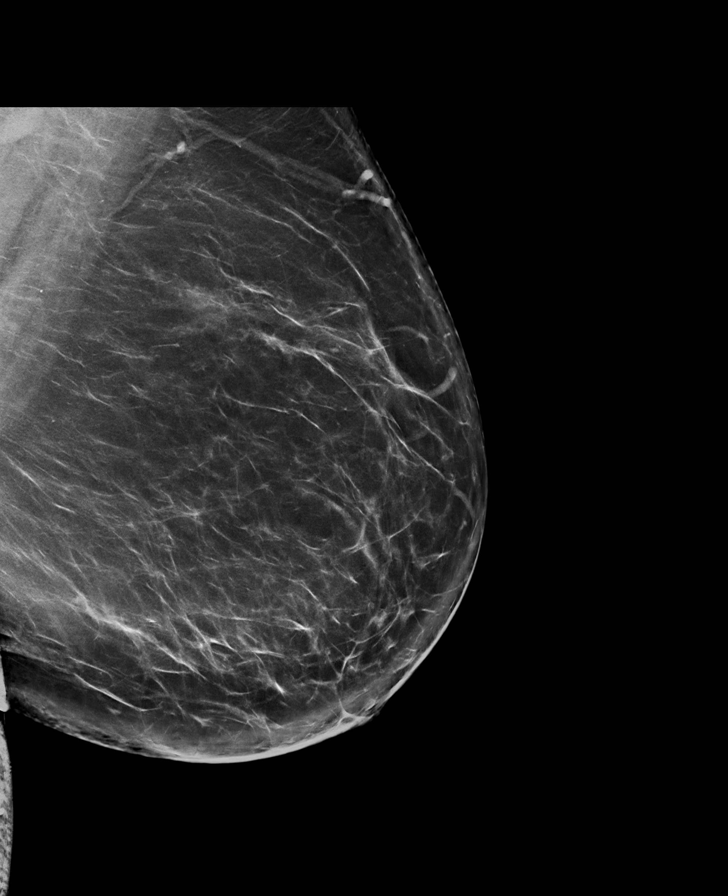

[L CC synth-2D]
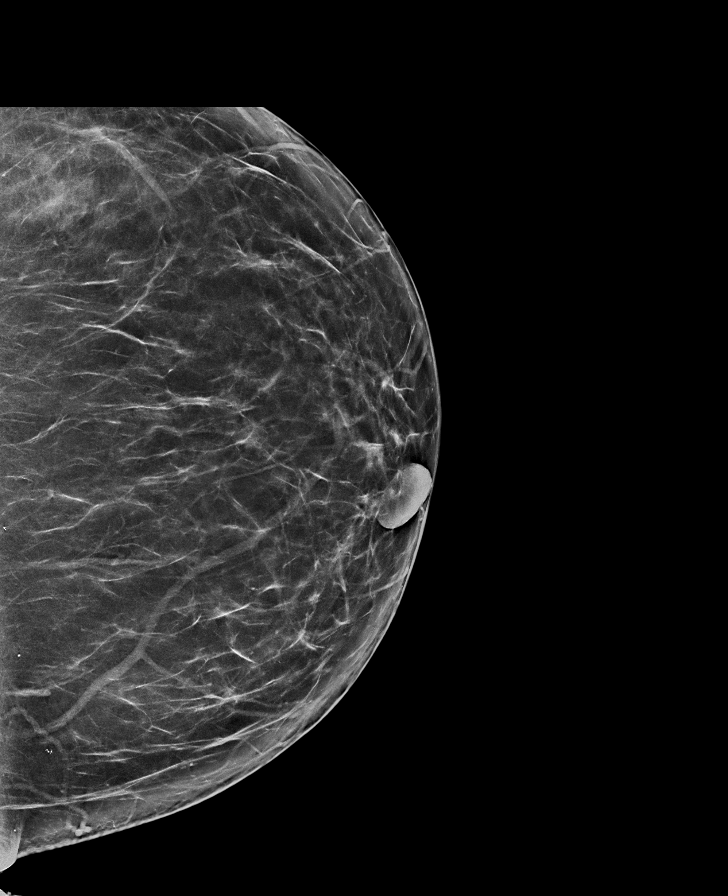

[R MLO synth-2D]
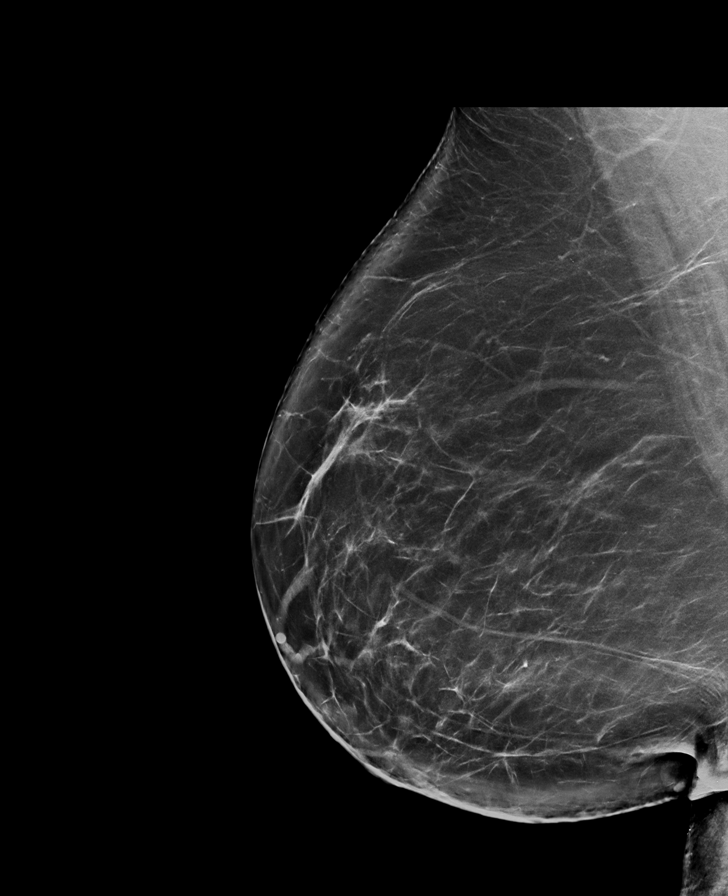

[R CC tomo · tomo slice 43/85.0]
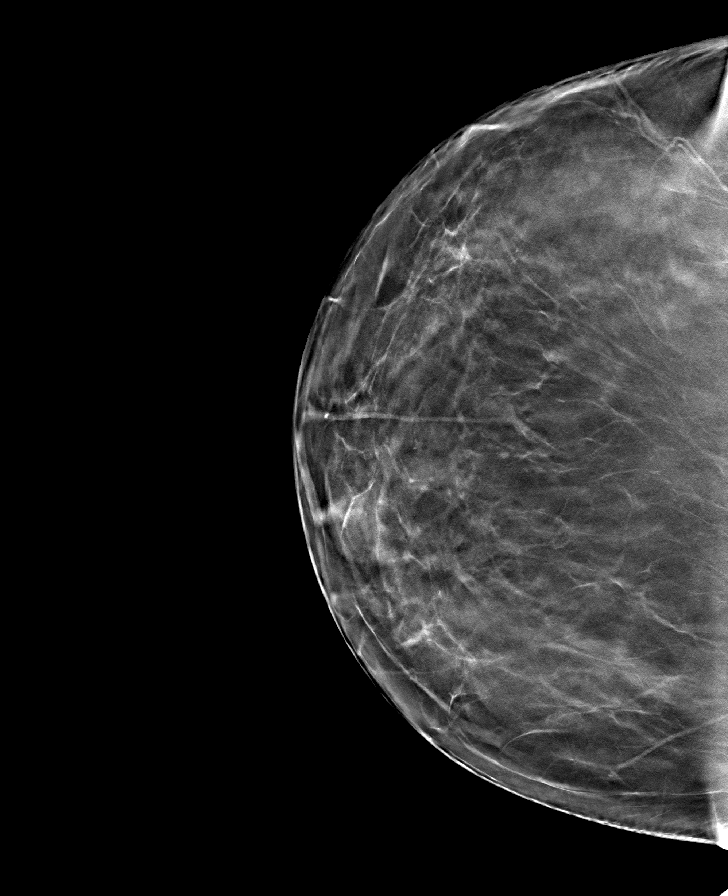

[R MLO tomo · tomo slice 50/99.0]
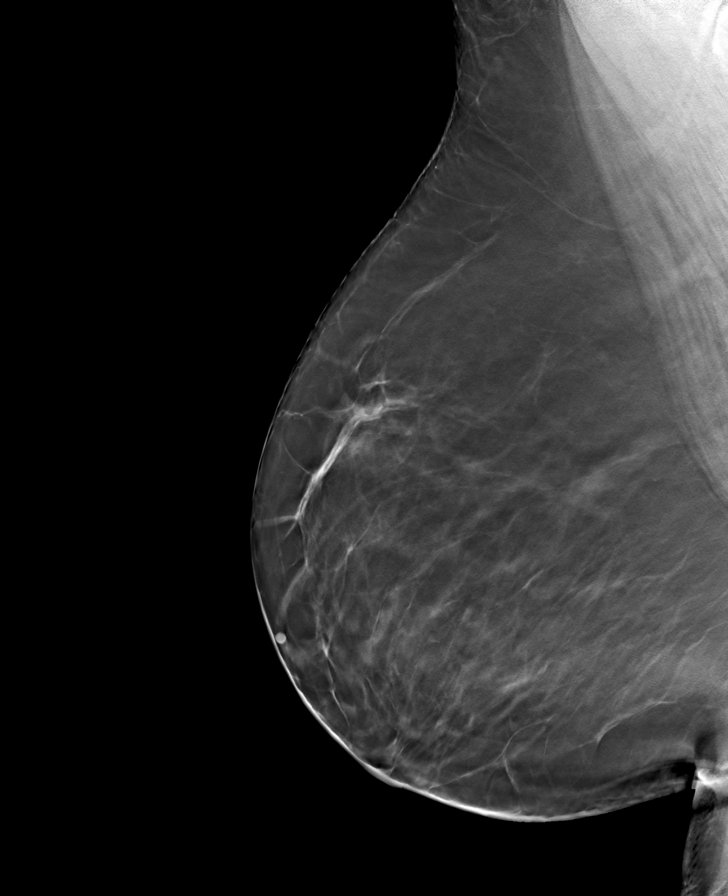

[L CC tomo · tomo slice 42/83.0]
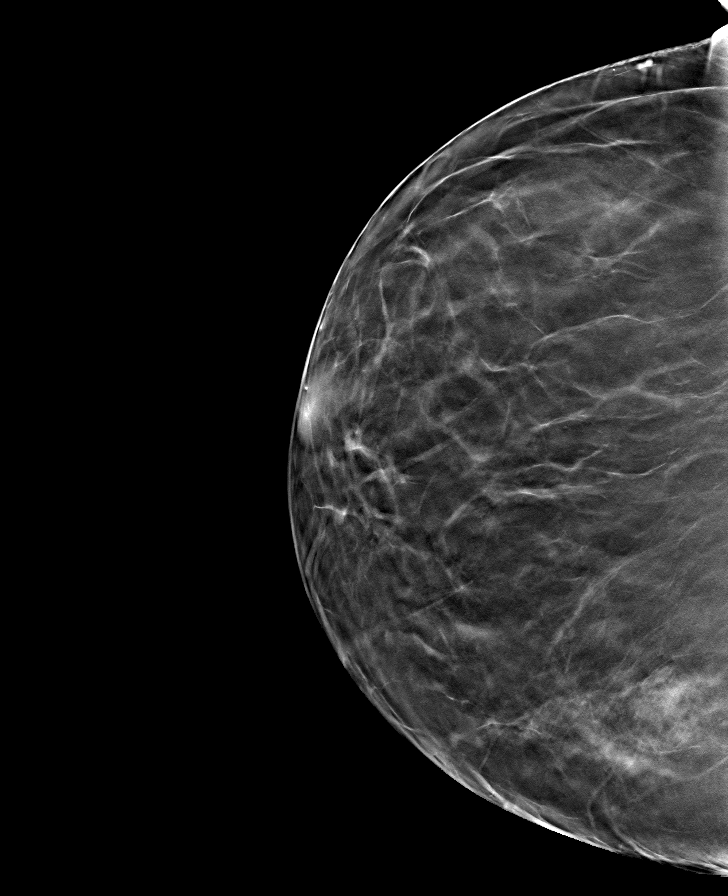

[L MLO tomo · tomo slice 51/102.0]
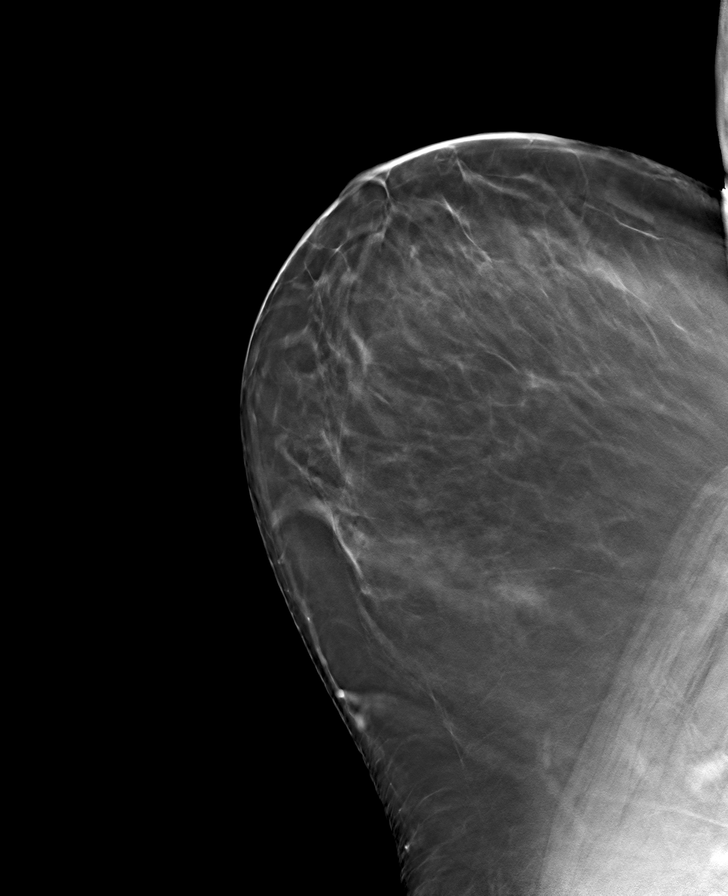

[8 of 24 positions shown; findings below may reference images not displayed]

ACR Breast Density Category b: There are scattered areas of
fibroglandular density.
FINDINGS: There are no findings suspicious for malignancy. Images were
processed with CAD.
IMPRESSION: No mammographic evidence of malignancy. A result letter of this
screening mammogram will be mailed directly to the patient.

RECOMMENDATION:
Screening mammogram in one year. (Code:[id number])

BI-RADS CATEGORY  1: Negative.

## 2019-09-10 ENCOUNTER — Other Ambulatory Visit: Payer: Self-pay

## 2019-09-10 ENCOUNTER — Encounter (INDEPENDENT_AMBULATORY_CARE_PROVIDER_SITE_OTHER): Payer: BC Managed Care – PPO | Admitting: Ophthalmology

## 2019-09-10 DIAGNOSIS — H35033 Hypertensive retinopathy, bilateral: Secondary | ICD-10-CM

## 2019-09-10 DIAGNOSIS — H35072 Retinal telangiectasis, left eye: Secondary | ICD-10-CM

## 2019-09-10 DIAGNOSIS — H2513 Age-related nuclear cataract, bilateral: Secondary | ICD-10-CM

## 2019-09-10 DIAGNOSIS — H353231 Exudative age-related macular degeneration, bilateral, with active choroidal neovascularization: Secondary | ICD-10-CM

## 2019-09-10 DIAGNOSIS — H43813 Vitreous degeneration, bilateral: Secondary | ICD-10-CM

## 2019-09-10 DIAGNOSIS — I10 Essential (primary) hypertension: Secondary | ICD-10-CM

## 2019-10-08 ENCOUNTER — Other Ambulatory Visit: Payer: Self-pay

## 2019-10-08 ENCOUNTER — Encounter (INDEPENDENT_AMBULATORY_CARE_PROVIDER_SITE_OTHER): Payer: BC Managed Care – PPO | Admitting: Ophthalmology

## 2019-10-08 DIAGNOSIS — H2513 Age-related nuclear cataract, bilateral: Secondary | ICD-10-CM

## 2019-10-08 DIAGNOSIS — H35073 Retinal telangiectasis, bilateral: Secondary | ICD-10-CM

## 2019-10-08 DIAGNOSIS — H353231 Exudative age-related macular degeneration, bilateral, with active choroidal neovascularization: Secondary | ICD-10-CM

## 2019-10-08 DIAGNOSIS — H35033 Hypertensive retinopathy, bilateral: Secondary | ICD-10-CM | POA: Diagnosis not present

## 2019-10-08 DIAGNOSIS — I10 Essential (primary) hypertension: Secondary | ICD-10-CM | POA: Diagnosis not present

## 2019-10-08 DIAGNOSIS — H43813 Vitreous degeneration, bilateral: Secondary | ICD-10-CM

## 2019-10-28 ENCOUNTER — Other Ambulatory Visit: Payer: Self-pay

## 2019-10-28 ENCOUNTER — Encounter: Payer: Self-pay | Admitting: Emergency Medicine

## 2019-10-28 ENCOUNTER — Emergency Department
Admission: EM | Admit: 2019-10-28 | Discharge: 2019-10-28 | Disposition: A | Discharge status: Home / Self Care | Payer: BC Managed Care – PPO | Attending: Emergency Medicine | Admitting: Emergency Medicine

## 2019-10-28 DIAGNOSIS — I10 Essential (primary) hypertension: Secondary | ICD-10-CM | POA: Insufficient documentation

## 2019-10-28 DIAGNOSIS — R1031 Right lower quadrant pain: Secondary | ICD-10-CM | POA: Diagnosis not present

## 2019-10-28 DIAGNOSIS — E119 Type 2 diabetes mellitus without complications: Secondary | ICD-10-CM | POA: Insufficient documentation

## 2019-10-28 DIAGNOSIS — E039 Hypothyroidism, unspecified: Secondary | ICD-10-CM | POA: Diagnosis not present

## 2019-10-28 DIAGNOSIS — M545 Low back pain, unspecified: Secondary | ICD-10-CM

## 2019-10-28 DIAGNOSIS — R109 Unspecified abdominal pain: Secondary | ICD-10-CM

## 2019-10-28 DIAGNOSIS — Z79899 Other long term (current) drug therapy: Secondary | ICD-10-CM | POA: Diagnosis not present

## 2019-10-28 DIAGNOSIS — Z7984 Long term (current) use of oral hypoglycemic drugs: Secondary | ICD-10-CM | POA: Diagnosis not present

## 2019-10-28 LAB — URINALYSIS, COMPLETE (UACMP) WITH MICROSCOPIC
Bilirubin Urine: NEGATIVE
Glucose, UA: NEGATIVE mg/dL
Hgb urine dipstick: NEGATIVE
Ketones, ur: NEGATIVE mg/dL
Leukocytes,Ua: NEGATIVE
Nitrite: NEGATIVE
Protein, ur: NEGATIVE mg/dL
Specific Gravity, Urine: 1.009 (ref 1.005–1.030)
pH: 5 (ref 5.0–8.0)

## 2019-10-28 LAB — COMPREHENSIVE METABOLIC PANEL
ALT: 22 U/L (ref 0–44)
AST: 34 U/L (ref 15–41)
Albumin: 3.9 g/dL (ref 3.5–5.0)
Alkaline Phosphatase: 55 U/L (ref 38–126)
Anion gap: 9 (ref 5–15)
BUN: 19 mg/dL (ref 6–20)
CO2: 29 mmol/L (ref 22–32)
Calcium: 9.2 mg/dL (ref 8.9–10.3)
Chloride: 102 mmol/L (ref 98–111)
Creatinine, Ser: 0.88 mg/dL (ref 0.44–1.00)
GFR calc Af Amer: >60 mL/min (ref >60)
GFR calc non Af Amer: >60 mL/min (ref >60)
Glucose, Bld: 101 mg/dL — ABNORMAL HIGH (ref 70–99)
Potassium: 4.1 mmol/L (ref 3.5–5.1)
Sodium: 140 mmol/L (ref 135–145)
Total Bilirubin: 0.6 mg/dL (ref 0.3–1.2)
Total Protein: 7 g/dL (ref 6.5–8.1)

## 2019-10-28 LAB — CBC WITH DIFFERENTIAL/PLATELET
Abs Immature Granulocytes: 0.07 10*3/uL (ref 0.00–0.07)
Basophils Absolute: 0 10*3/uL (ref 0.0–0.1)
Basophils Relative: 0 %
Eosinophils Absolute: 0.3 10*3/uL (ref 0.0–0.5)
Eosinophils Relative: 4 %
HCT: 38.8 % (ref 36.0–46.0)
Hemoglobin: 12.5 g/dL (ref 12.0–15.0)
Immature Granulocytes: 1 %
Lymphocytes Relative: 28 %
Lymphs Abs: 2 10*3/uL (ref 0.7–4.0)
MCH: 28.5 pg (ref 26.0–34.0)
MCHC: 32.2 g/dL (ref 30.0–36.0)
MCV: 88.4 fL (ref 80.0–100.0)
Monocytes Absolute: 0.5 10*3/uL (ref 0.1–1.0)
Monocytes Relative: 7 %
Neutro Abs: 4.4 10*3/uL (ref 1.7–7.7)
Neutrophils Relative %: 60 %
Platelets: 223 10*3/uL (ref 150–400)
RBC: 4.39 MIL/uL (ref 3.87–5.11)
RDW: 13.4 % (ref 11.5–15.5)
WBC: 7.3 10*3/uL (ref 4.0–10.5)
nRBC: 0 % (ref 0.0–0.2)

## 2019-10-28 LAB — LIPASE, BLOOD: Lipase: 26 U/L (ref 11–51)

## 2019-10-28 MED ORDER — OXYCODONE-ACETAMINOPHEN 5-325 MG PO TABS: 1.0000 | ORAL_TABLET | ORAL | 0 refills | Status: AC | PRN

## 2019-10-28 MED ORDER — KETOROLAC TROMETHAMINE 30 MG/ML IJ SOLN
30.0000 mg | Freq: Once | INTRAMUSCULAR | Status: AC
  Administered 2019-10-28: 13:00:00 30 mg via INTRAVENOUS
  Filled 2019-10-28: qty 1

## 2019-10-28 MED ORDER — OXYCODONE-ACETAMINOPHEN 5-325 MG PO TABS
1.0000 | ORAL_TABLET | Freq: Once | ORAL | Status: AC
  Administered 2019-10-28: 1 via ORAL
  Filled 2019-10-28: qty 1

## 2019-10-28 MED ORDER — PREDNISONE 10 MG (21) PO TBPK: ORAL_TABLET | ORAL | 0 refills | Status: AC

## 2019-10-28 MED ORDER — LIDOCAINE 5 % EX PTCH: 1.0000 | MEDICATED_PATCH | Freq: Two times a day (BID) | CUTANEOUS | 0 refills | Status: AC

## 2019-10-28 NOTE — ED Provider Notes (Signed)
[facility] Emergency Department Provider Note    ____________________________________________   I have reviewed the triage vital signs and the nursing notes.   HISTORY  Chief Complaint Low back pain  History limited by: Not Limited   HPI Karen Wiley is a 49 y.o. female who presents to the emergency department today because of concern for low back pain and right lower quadrant pain. She first started noticing discomfort two days ago. Says that the pain goes across her bilateral lower back. The patient denies any unusual lifting, trauma or activity before the pain started. She has history of back pain before but became concerned when the pain started being present in her right lower quadrant. She has never had pain in her RLQ with the back pain before. She denies any change in urination or defecation. Had some nausea today. Denies any fevers. History of a hysterectomy. Did hurt her right knee a couple of weeks ago and has been recovering from that.    Records reviewed. Per medical record review patient has a history of DM, HTN.   Past Medical History:  Diagnosis Date  . Diabetes mellitus without complication (HCC)   . Difficult intubation   . 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 Hypothyroidism   . Sinus congestion   . Sleep apnea    cpap  . Thyroid disorder     Patient Active Problem List   Diagnosis Date Noted  . Leukocytosis 05/19/2016    Past Surgical History:  Procedure Laterality Date  . ABDOMINAL HYSTERECTOMY  2007  . CESAREAN SECTION  2007  . CHONDROPLASTY Left 08/28/2017   Procedure: CHONDROPLASTY;  Surgeon: Signa Kell, MD;  Location: [facility] ORS;  Service: Orthopedics;  Laterality: Left;  . KNEE ARTHROSCOPY WITH MENISCAL REPAIR Left 08/28/2017   Procedure: KNEE ARTHROSCOPY WITH MENISCAL REPAIR, MEDIAL AND LATERAL REPAIR;  Surgeon: Signa Kell, MD;  Location: [facility] ORS;  Service: Orthopedics;  Laterality: Left;   . MOUTH SURGERY  2008   4 wisdom teeth pulled  . PARTIAL HYSTERECTOMY  2007  . TUBAL LIGATION  2007    Prior to Admission medications   Medication Sig Start Date End Date Taking? Authorizing Provider  Cholecalciferol 2000 units CAPS Take 2,000 Units daily by mouth.    [provider]  hydrochlorothiazide (HYDRODIURIL) 25 MG tablet Take 25 mg daily by mouth.  05/01/16   [provider]  lisinopril (PRINIVIL,ZESTRIL) 10 MG tablet Take 10 mg daily by mouth.     [provider]  loratadine (CLARITIN) 10 MG tablet Take 10 mg daily by mouth.    [provider]  lovastatin (MEVACOR) 20 MG tablet Take 20 mg daily by mouth. 06/13/17 01/23/18  [provider]  metFORMIN (GLUCOPHAGE) 500 MG tablet Take 1,000 mg by mouth daily with breakfast.  11/03/15 11/02/16  [provider]  metFORMIN (GLUCOPHAGE) 500 MG tablet Take 1,000 mg 2 (two) times daily by mouth. 06/13/17 06/13/18  [provider]    Allergies Ciprofloxacin and Tape  Family History  Problem Relation Age of Onset  . Breast cancer Paternal Aunt     Social History Social History   Tobacco Use  . Smoking status: Never Smoker  . Smokeless tobacco: Never Used  Substance Use Topics  . Alcohol use: No    Comment: rare glass of wine  . Drug use: No    Review of Systems Constitutional: No fever/chills Eyes: No visual changes. ENT: No  sore throat. Cardiovascular: Denies chest pain. Respiratory: Denies shortness of breath. Gastrointestinal: Positive for right lower quadrant pain. Genitourinary: Negative for dysuria. Musculoskeletal: Positive for bilateral lower back pain.  Skin: Negative for rash. Neurological: Negative for headaches, focal weakness or numbness.  ____________________________________________   PHYSICAL EXAM:  VITAL SIGNS: ED Triage Vitals [10/28/19 1129]  Enc Vitals Group     BP (!) 135/59     Pulse Rate 85     Resp 19     Temp 97.6 F (36.4 C)      Temp Source Oral     SpO2 97 %     Weight (!) 325 lb (147.4 kg)     Height 5' 10.5" (1.791 m)   Constitutional: Alert and oriented.  Eyes: Conjunctivae are normal.  ENT      Head: Normocephalic and atraumatic.      Nose: No congestion/rhinnorhea.      Mouth/Throat: Mucous membranes are moist.      Neck: No stridor. Hematological/Lymphatic/Immunilogical: No cervical lymphadenopathy. Cardiovascular: Normal rate, regular rhythm.  No murmurs, rubs, or gallops.  Respiratory: Normal respiratory effort without tachypnea nor retractions. Breath sounds are clear and equal bilaterally. No wheezes/rales/rhonchi. Gastrointestinal: Soft and non tender. No rebound. No guarding.  Genitourinary: Deferred Musculoskeletal: Normal range of motion in all extremities. No lower extremity edema. Tender to palpation in the lower back.  Neurologic:  Normal speech and language. No gross focal neurologic deficits are appreciated.  Skin:  Skin is warm, dry and intact. No rash noted. Psychiatric: Mood and affect are normal. Speech and behavior are normal. Patient exhibits appropriate insight and judgment.  ____________________________________________    LABS (pertinent positives/negatives)  Lipase 26 CMP wnl except glu 101 CBC wbc 7.3, hgb 12.5, plt 223 UA clear, wbc 0-5, bacteria rare  ____________________________________________   EKG  None  ____________________________________________    RADIOLOGY  None  ____________________________________________   PROCEDURES  Procedures  ____________________________________________   INITIAL IMPRESSION / ASSESSMENT AND PLAN / ED COURSE  Pertinent labs & imaging results that were available during my care of the patient were reviewed by me and considered in my medical decision making (see chart for details).   Patient presented to the emergency department today because of concern for low back pain and right lowe quadrant pain. On exam patient  does have tenderness to the lower back and some tenderness to the RLQ. Blood work without leukocytosis, patient is afebrile. At this time I have lower suspicion for appendicitis, especially given the tenderness to the lower spine. Discussed this with the patient. She felt comfortable deferring imaging. Discussed appendicitis return precautions. Will plan on treating for acute low back pain. Doubt kidney stone, AAA, other intraabdominal infection.  ____________________________________________   FINAL CLINICAL IMPRESSION(S) / ED DIAGNOSES  Final diagnoses:  Bilateral low back pain without sciatica, unspecified chronicity  Abdominal pain, unspecified abdominal location     Note: This dictation was prepared with Dragon dictation. Any transcriptional errors that result from this process are unintentional     Nance Pear, MD 10/28/19 1334

## 2019-10-28 NOTE — Discharge Instructions (Addendum)
As we discussed please return for any fevers, vomiting, bloody stool or any worsening symptoms.

## 2019-10-28 NOTE — ED Triage Notes (Signed)
Presents with right lower back pain   And now states pain is moving into RLQ  States she twisted her right knee about 1 week ago     Also states she is having nausea  Denies any fever or vomiting   Also denies any urinary sxs'

## 2019-11-07 ENCOUNTER — Encounter (INDEPENDENT_AMBULATORY_CARE_PROVIDER_SITE_OTHER): Payer: BC Managed Care – PPO | Admitting: Ophthalmology

## 2019-11-07 DIAGNOSIS — H43813 Vitreous degeneration, bilateral: Secondary | ICD-10-CM

## 2019-11-07 DIAGNOSIS — I10 Essential (primary) hypertension: Secondary | ICD-10-CM | POA: Diagnosis not present

## 2019-11-07 DIAGNOSIS — H353231 Exudative age-related macular degeneration, bilateral, with active choroidal neovascularization: Secondary | ICD-10-CM | POA: Diagnosis not present

## 2019-11-07 DIAGNOSIS — H35033 Hypertensive retinopathy, bilateral: Secondary | ICD-10-CM | POA: Diagnosis not present

## 2019-11-07 DIAGNOSIS — H35073 Retinal telangiectasis, bilateral: Secondary | ICD-10-CM

## 2019-12-12 ENCOUNTER — Other Ambulatory Visit: Payer: Self-pay

## 2019-12-12 ENCOUNTER — Encounter (INDEPENDENT_AMBULATORY_CARE_PROVIDER_SITE_OTHER): Payer: BC Managed Care – PPO | Admitting: Ophthalmology

## 2019-12-12 DIAGNOSIS — H43813 Vitreous degeneration, bilateral: Secondary | ICD-10-CM

## 2019-12-12 DIAGNOSIS — H353231 Exudative age-related macular degeneration, bilateral, with active choroidal neovascularization: Secondary | ICD-10-CM

## 2019-12-12 DIAGNOSIS — I10 Essential (primary) hypertension: Secondary | ICD-10-CM

## 2019-12-12 DIAGNOSIS — H35033 Hypertensive retinopathy, bilateral: Secondary | ICD-10-CM

## 2020-01-09 ENCOUNTER — Encounter (INDEPENDENT_AMBULATORY_CARE_PROVIDER_SITE_OTHER): Payer: BC Managed Care – PPO | Admitting: Ophthalmology

## 2020-01-09 DIAGNOSIS — H353231 Exudative age-related macular degeneration, bilateral, with active choroidal neovascularization: Secondary | ICD-10-CM | POA: Diagnosis not present

## 2020-01-09 DIAGNOSIS — H35033 Hypertensive retinopathy, bilateral: Secondary | ICD-10-CM | POA: Diagnosis not present

## 2020-01-09 DIAGNOSIS — H43813 Vitreous degeneration, bilateral: Secondary | ICD-10-CM

## 2020-01-09 DIAGNOSIS — H35073 Retinal telangiectasis, bilateral: Secondary | ICD-10-CM

## 2020-01-09 DIAGNOSIS — H2513 Age-related nuclear cataract, bilateral: Secondary | ICD-10-CM

## 2020-01-09 DIAGNOSIS — I10 Essential (primary) hypertension: Secondary | ICD-10-CM

## 2020-01-16 ENCOUNTER — Ambulatory Visit: Payer: BC Managed Care – PPO | Attending: Internal Medicine

## 2020-01-16 DIAGNOSIS — Z23 Encounter for immunization: Secondary | ICD-10-CM

## 2020-01-16 NOTE — Progress Notes (Signed)
   Covid-19 Vaccination Clinic  Name:  Karen Wiley    MRN: [id number] DOB: [birth year]  01/16/2020  [name] was observed post Covid-19 immunization for 15 minutes without incident. She was provided with Vaccine Information Sheet and instruction to access the V-Safe system.   [name] was instructed to call 911 with any severe reactions post vaccine: Marland Kitchen Difficulty breathing  . Swelling of face and throat  . A fast heartbeat  . A bad rash all over body  . Dizziness and weakness   Immunizations Administered    Name Date Dose VIS Date Route   Pfizer COVID-19 Vaccine 01/16/2020  9:45 AM 0.3 mL 09/27/2019 Intramuscular   Manufacturer: [employer], [employer]   Lot: [phone] [id number]   NDC: [address]-9

## 2020-02-06 ENCOUNTER — Other Ambulatory Visit: Payer: Self-pay

## 2020-02-06 ENCOUNTER — Encounter (INDEPENDENT_AMBULATORY_CARE_PROVIDER_SITE_OTHER): Payer: BC Managed Care – PPO | Admitting: Ophthalmology

## 2020-02-06 DIAGNOSIS — I10 Essential (primary) hypertension: Secondary | ICD-10-CM | POA: Diagnosis not present

## 2020-02-06 DIAGNOSIS — H43813 Vitreous degeneration, bilateral: Secondary | ICD-10-CM

## 2020-02-06 DIAGNOSIS — H353231 Exudative age-related macular degeneration, bilateral, with active choroidal neovascularization: Secondary | ICD-10-CM | POA: Diagnosis not present

## 2020-02-06 DIAGNOSIS — H35033 Hypertensive retinopathy, bilateral: Secondary | ICD-10-CM

## 2020-02-12 ENCOUNTER — Ambulatory Visit: Payer: BC Managed Care – PPO | Attending: Internal Medicine

## 2020-02-12 DIAGNOSIS — Z23 Encounter for immunization: Secondary | ICD-10-CM

## 2020-02-12 NOTE — Progress Notes (Signed)
   Covid-19 Vaccination Clinic  Name:  Karen Wiley    MRN: [id number] DOB: [birth year]  02/12/2020  [name] was observed post Covid-19 immunization for 15 minutes without incident. She was provided with Vaccine Information Sheet and instruction to access the V-Safe system.   [name] was instructed to call 911 with any severe reactions post vaccine: Marland Kitchen Difficulty breathing  . Swelling of face and throat  . A fast heartbeat  . A bad rash all over body  . Dizziness and weakness   Immunizations Administered    Name Date Dose VIS Date Route   Pfizer COVID-19 Vaccine 02/12/2020  3:29 PM 0.3 mL 12/11/2018 Intramuscular   Manufacturer: [employer], [employer]   Lot: [id number]   NDC: [address]-6

## 2020-03-05 ENCOUNTER — Encounter (INDEPENDENT_AMBULATORY_CARE_PROVIDER_SITE_OTHER): Payer: BC Managed Care – PPO | Admitting: Ophthalmology

## 2020-03-05 ENCOUNTER — Other Ambulatory Visit: Payer: Self-pay

## 2020-03-05 DIAGNOSIS — I10 Essential (primary) hypertension: Secondary | ICD-10-CM | POA: Diagnosis not present

## 2020-03-05 DIAGNOSIS — H35033 Hypertensive retinopathy, bilateral: Secondary | ICD-10-CM | POA: Diagnosis not present

## 2020-03-05 DIAGNOSIS — H35073 Retinal telangiectasis, bilateral: Secondary | ICD-10-CM

## 2020-03-05 DIAGNOSIS — H43813 Vitreous degeneration, bilateral: Secondary | ICD-10-CM

## 2020-03-05 DIAGNOSIS — H353231 Exudative age-related macular degeneration, bilateral, with active choroidal neovascularization: Secondary | ICD-10-CM

## 2020-04-02 ENCOUNTER — Other Ambulatory Visit: Payer: Self-pay

## 2020-04-02 ENCOUNTER — Encounter (INDEPENDENT_AMBULATORY_CARE_PROVIDER_SITE_OTHER): Payer: BC Managed Care – PPO | Admitting: Ophthalmology

## 2020-04-02 DIAGNOSIS — H43813 Vitreous degeneration, bilateral: Secondary | ICD-10-CM

## 2020-04-02 DIAGNOSIS — H35033 Hypertensive retinopathy, bilateral: Secondary | ICD-10-CM

## 2020-04-02 DIAGNOSIS — I10 Essential (primary) hypertension: Secondary | ICD-10-CM | POA: Diagnosis not present

## 2020-04-02 DIAGNOSIS — H353231 Exudative age-related macular degeneration, bilateral, with active choroidal neovascularization: Secondary | ICD-10-CM

## 2020-04-30 ENCOUNTER — Other Ambulatory Visit: Payer: Self-pay

## 2020-04-30 ENCOUNTER — Encounter (INDEPENDENT_AMBULATORY_CARE_PROVIDER_SITE_OTHER): Payer: BC Managed Care – PPO | Admitting: Ophthalmology

## 2020-04-30 DIAGNOSIS — I10 Essential (primary) hypertension: Secondary | ICD-10-CM | POA: Diagnosis not present

## 2020-04-30 DIAGNOSIS — H35033 Hypertensive retinopathy, bilateral: Secondary | ICD-10-CM

## 2020-04-30 DIAGNOSIS — H43813 Vitreous degeneration, bilateral: Secondary | ICD-10-CM | POA: Diagnosis not present

## 2020-04-30 DIAGNOSIS — H353231 Exudative age-related macular degeneration, bilateral, with active choroidal neovascularization: Secondary | ICD-10-CM

## 2020-05-27 ENCOUNTER — Other Ambulatory Visit: Payer: Self-pay | Admitting: Family Medicine

## 2020-05-27 DIAGNOSIS — Z1231 Encounter for screening mammogram for malignant neoplasm of breast: Secondary | ICD-10-CM

## 2020-05-28 ENCOUNTER — Encounter (INDEPENDENT_AMBULATORY_CARE_PROVIDER_SITE_OTHER): Payer: BC Managed Care – PPO | Admitting: Ophthalmology

## 2020-05-28 ENCOUNTER — Other Ambulatory Visit: Payer: Self-pay

## 2020-05-28 DIAGNOSIS — H35033 Hypertensive retinopathy, bilateral: Secondary | ICD-10-CM

## 2020-05-28 DIAGNOSIS — H318 Other specified disorders of choroid: Secondary | ICD-10-CM | POA: Diagnosis not present

## 2020-05-28 DIAGNOSIS — I10 Essential (primary) hypertension: Secondary | ICD-10-CM

## 2020-05-28 DIAGNOSIS — H353231 Exudative age-related macular degeneration, bilateral, with active choroidal neovascularization: Secondary | ICD-10-CM | POA: Diagnosis not present

## 2020-05-28 DIAGNOSIS — H35072 Retinal telangiectasis, left eye: Secondary | ICD-10-CM

## 2020-06-16 ENCOUNTER — Other Ambulatory Visit: Payer: Self-pay

## 2020-06-16 ENCOUNTER — Ambulatory Visit
Admission: RE | Admit: 2020-06-16 | Discharge: 2020-06-16 | Disposition: A | Discharge status: Home / Self Care | Payer: BC Managed Care – PPO | Source: Ambulatory Visit | Attending: Family Medicine | Admitting: Family Medicine

## 2020-06-16 DIAGNOSIS — Z1231 Encounter for screening mammogram for malignant neoplasm of breast: Secondary | ICD-10-CM | POA: Diagnosis present

## 2020-06-25 ENCOUNTER — Other Ambulatory Visit: Payer: Self-pay

## 2020-06-25 ENCOUNTER — Encounter (INDEPENDENT_AMBULATORY_CARE_PROVIDER_SITE_OTHER): Payer: BC Managed Care – PPO | Admitting: Ophthalmology

## 2020-06-25 DIAGNOSIS — H353231 Exudative age-related macular degeneration, bilateral, with active choroidal neovascularization: Secondary | ICD-10-CM | POA: Diagnosis not present

## 2020-06-25 DIAGNOSIS — H43813 Vitreous degeneration, bilateral: Secondary | ICD-10-CM | POA: Diagnosis not present

## 2020-06-25 DIAGNOSIS — H35072 Retinal telangiectasis, left eye: Secondary | ICD-10-CM

## 2020-07-22 ENCOUNTER — Other Ambulatory Visit: Payer: Self-pay

## 2020-07-22 ENCOUNTER — Encounter (INDEPENDENT_AMBULATORY_CARE_PROVIDER_SITE_OTHER): Payer: BC Managed Care – PPO | Admitting: Ophthalmology

## 2020-07-22 DIAGNOSIS — I10 Essential (primary) hypertension: Secondary | ICD-10-CM | POA: Diagnosis not present

## 2020-07-22 DIAGNOSIS — H35033 Hypertensive retinopathy, bilateral: Secondary | ICD-10-CM | POA: Diagnosis not present

## 2020-07-22 DIAGNOSIS — H353231 Exudative age-related macular degeneration, bilateral, with active choroidal neovascularization: Secondary | ICD-10-CM | POA: Diagnosis not present

## 2020-07-22 DIAGNOSIS — H43813 Vitreous degeneration, bilateral: Secondary | ICD-10-CM | POA: Diagnosis not present

## 2020-08-19 ENCOUNTER — Encounter (INDEPENDENT_AMBULATORY_CARE_PROVIDER_SITE_OTHER): Payer: BC Managed Care – PPO | Admitting: Ophthalmology

## 2020-08-19 ENCOUNTER — Other Ambulatory Visit: Payer: Self-pay

## 2020-08-19 DIAGNOSIS — I10 Essential (primary) hypertension: Secondary | ICD-10-CM | POA: Diagnosis not present

## 2020-08-19 DIAGNOSIS — H35033 Hypertensive retinopathy, bilateral: Secondary | ICD-10-CM

## 2020-08-19 DIAGNOSIS — H43813 Vitreous degeneration, bilateral: Secondary | ICD-10-CM

## 2020-08-19 DIAGNOSIS — H353231 Exudative age-related macular degeneration, bilateral, with active choroidal neovascularization: Secondary | ICD-10-CM | POA: Diagnosis not present

## 2020-09-16 ENCOUNTER — Encounter (INDEPENDENT_AMBULATORY_CARE_PROVIDER_SITE_OTHER): Payer: BC Managed Care – PPO | Admitting: Ophthalmology

## 2020-09-23 ENCOUNTER — Encounter (INDEPENDENT_AMBULATORY_CARE_PROVIDER_SITE_OTHER): Payer: BC Managed Care – PPO | Admitting: Ophthalmology

## 2020-09-23 ENCOUNTER — Other Ambulatory Visit: Payer: Self-pay

## 2020-09-23 DIAGNOSIS — H35033 Hypertensive retinopathy, bilateral: Secondary | ICD-10-CM

## 2020-09-23 DIAGNOSIS — H43813 Vitreous degeneration, bilateral: Secondary | ICD-10-CM | POA: Diagnosis not present

## 2020-09-23 DIAGNOSIS — I10 Essential (primary) hypertension: Secondary | ICD-10-CM | POA: Diagnosis not present

## 2020-09-23 DIAGNOSIS — H353231 Exudative age-related macular degeneration, bilateral, with active choroidal neovascularization: Secondary | ICD-10-CM | POA: Diagnosis not present

## 2020-10-23 ENCOUNTER — Other Ambulatory Visit: Payer: BC Managed Care – PPO

## 2020-10-23 DIAGNOSIS — Z20822 Contact with and (suspected) exposure to covid-19: Secondary | ICD-10-CM

## 2020-10-27 LAB — NOVEL CORONAVIRUS, NAA: SARS-CoV-2, NAA: NOT DETECTED

## 2020-10-28 ENCOUNTER — Encounter (INDEPENDENT_AMBULATORY_CARE_PROVIDER_SITE_OTHER): Payer: BC Managed Care – PPO | Admitting: Ophthalmology

## 2020-11-04 ENCOUNTER — Other Ambulatory Visit: Payer: Self-pay

## 2020-11-04 ENCOUNTER — Encounter (INDEPENDENT_AMBULATORY_CARE_PROVIDER_SITE_OTHER): Payer: BC Managed Care – PPO | Admitting: Ophthalmology

## 2020-11-04 DIAGNOSIS — H43813 Vitreous degeneration, bilateral: Secondary | ICD-10-CM

## 2020-11-04 DIAGNOSIS — H353231 Exudative age-related macular degeneration, bilateral, with active choroidal neovascularization: Secondary | ICD-10-CM

## 2020-11-04 DIAGNOSIS — H35033 Hypertensive retinopathy, bilateral: Secondary | ICD-10-CM

## 2020-11-04 DIAGNOSIS — I10 Essential (primary) hypertension: Secondary | ICD-10-CM

## 2020-12-16 ENCOUNTER — Encounter (INDEPENDENT_AMBULATORY_CARE_PROVIDER_SITE_OTHER): Payer: BC Managed Care – PPO | Admitting: Ophthalmology

## 2020-12-16 ENCOUNTER — Other Ambulatory Visit: Payer: Self-pay

## 2020-12-16 DIAGNOSIS — H43813 Vitreous degeneration, bilateral: Secondary | ICD-10-CM

## 2020-12-16 DIAGNOSIS — H35033 Hypertensive retinopathy, bilateral: Secondary | ICD-10-CM | POA: Diagnosis not present

## 2020-12-16 DIAGNOSIS — I10 Essential (primary) hypertension: Secondary | ICD-10-CM | POA: Diagnosis not present

## 2020-12-16 DIAGNOSIS — H353231 Exudative age-related macular degeneration, bilateral, with active choroidal neovascularization: Secondary | ICD-10-CM | POA: Diagnosis not present

## 2021-01-27 ENCOUNTER — Encounter (INDEPENDENT_AMBULATORY_CARE_PROVIDER_SITE_OTHER): Payer: BC Managed Care – PPO | Admitting: Ophthalmology

## 2021-02-09 ENCOUNTER — Other Ambulatory Visit: Payer: Self-pay

## 2021-02-09 ENCOUNTER — Encounter (INDEPENDENT_AMBULATORY_CARE_PROVIDER_SITE_OTHER): Payer: BC Managed Care – PPO | Admitting: Ophthalmology

## 2021-02-09 DIAGNOSIS — H353211 Exudative age-related macular degeneration, right eye, with active choroidal neovascularization: Secondary | ICD-10-CM

## 2021-02-09 DIAGNOSIS — H35033 Hypertensive retinopathy, bilateral: Secondary | ICD-10-CM

## 2021-02-09 DIAGNOSIS — I10 Essential (primary) hypertension: Secondary | ICD-10-CM

## 2021-02-09 DIAGNOSIS — H35072 Retinal telangiectasis, left eye: Secondary | ICD-10-CM | POA: Diagnosis not present

## 2021-02-09 DIAGNOSIS — H43813 Vitreous degeneration, bilateral: Secondary | ICD-10-CM

## 2021-03-31 ENCOUNTER — Other Ambulatory Visit: Payer: Self-pay

## 2021-03-31 ENCOUNTER — Encounter (INDEPENDENT_AMBULATORY_CARE_PROVIDER_SITE_OTHER): Payer: BC Managed Care – PPO | Admitting: Ophthalmology

## 2021-03-31 DIAGNOSIS — H35033 Hypertensive retinopathy, bilateral: Secondary | ICD-10-CM | POA: Diagnosis not present

## 2021-03-31 DIAGNOSIS — H35073 Retinal telangiectasis, bilateral: Secondary | ICD-10-CM | POA: Diagnosis not present

## 2021-03-31 DIAGNOSIS — H353231 Exudative age-related macular degeneration, bilateral, with active choroidal neovascularization: Secondary | ICD-10-CM | POA: Diagnosis not present

## 2021-03-31 DIAGNOSIS — I10 Essential (primary) hypertension: Secondary | ICD-10-CM | POA: Diagnosis not present

## 2021-03-31 DIAGNOSIS — H2513 Age-related nuclear cataract, bilateral: Secondary | ICD-10-CM

## 2021-03-31 DIAGNOSIS — H43813 Vitreous degeneration, bilateral: Secondary | ICD-10-CM

## 2021-05-12 ENCOUNTER — Other Ambulatory Visit: Payer: Self-pay

## 2021-05-12 ENCOUNTER — Encounter (INDEPENDENT_AMBULATORY_CARE_PROVIDER_SITE_OTHER): Payer: BC Managed Care – PPO | Admitting: Ophthalmology

## 2021-05-12 DIAGNOSIS — H43813 Vitreous degeneration, bilateral: Secondary | ICD-10-CM

## 2021-05-12 DIAGNOSIS — I10 Essential (primary) hypertension: Secondary | ICD-10-CM | POA: Diagnosis not present

## 2021-05-12 DIAGNOSIS — H35033 Hypertensive retinopathy, bilateral: Secondary | ICD-10-CM | POA: Diagnosis not present

## 2021-05-12 DIAGNOSIS — H353231 Exudative age-related macular degeneration, bilateral, with active choroidal neovascularization: Secondary | ICD-10-CM | POA: Diagnosis not present

## 2021-05-12 DIAGNOSIS — H35073 Retinal telangiectasis, bilateral: Secondary | ICD-10-CM

## 2021-06-30 ENCOUNTER — Encounter (INDEPENDENT_AMBULATORY_CARE_PROVIDER_SITE_OTHER): Payer: BC Managed Care – PPO | Admitting: Ophthalmology

## 2021-06-30 ENCOUNTER — Other Ambulatory Visit: Payer: Self-pay

## 2021-06-30 DIAGNOSIS — H353231 Exudative age-related macular degeneration, bilateral, with active choroidal neovascularization: Secondary | ICD-10-CM

## 2021-06-30 DIAGNOSIS — H35073 Retinal telangiectasis, bilateral: Secondary | ICD-10-CM | POA: Diagnosis not present

## 2021-06-30 DIAGNOSIS — H35033 Hypertensive retinopathy, bilateral: Secondary | ICD-10-CM | POA: Diagnosis not present

## 2021-06-30 DIAGNOSIS — I10 Essential (primary) hypertension: Secondary | ICD-10-CM

## 2021-06-30 DIAGNOSIS — H43813 Vitreous degeneration, bilateral: Secondary | ICD-10-CM

## 2021-08-18 ENCOUNTER — Encounter (INDEPENDENT_AMBULATORY_CARE_PROVIDER_SITE_OTHER): Payer: BC Managed Care – PPO | Admitting: Ophthalmology

## 2021-08-18 ENCOUNTER — Other Ambulatory Visit: Payer: Self-pay

## 2021-08-18 DIAGNOSIS — H35073 Retinal telangiectasis, bilateral: Secondary | ICD-10-CM | POA: Diagnosis not present

## 2021-08-18 DIAGNOSIS — H43813 Vitreous degeneration, bilateral: Secondary | ICD-10-CM

## 2021-08-18 DIAGNOSIS — H353231 Exudative age-related macular degeneration, bilateral, with active choroidal neovascularization: Secondary | ICD-10-CM | POA: Diagnosis not present

## 2021-08-18 DIAGNOSIS — I10 Essential (primary) hypertension: Secondary | ICD-10-CM | POA: Diagnosis not present

## 2021-08-18 DIAGNOSIS — H35033 Hypertensive retinopathy, bilateral: Secondary | ICD-10-CM | POA: Diagnosis not present

## 2021-08-18 DIAGNOSIS — H2513 Age-related nuclear cataract, bilateral: Secondary | ICD-10-CM

## 2021-10-06 ENCOUNTER — Encounter (INDEPENDENT_AMBULATORY_CARE_PROVIDER_SITE_OTHER): Payer: BC Managed Care – PPO | Admitting: Ophthalmology

## 2021-10-06 ENCOUNTER — Other Ambulatory Visit: Payer: Self-pay

## 2021-10-06 DIAGNOSIS — H353231 Exudative age-related macular degeneration, bilateral, with active choroidal neovascularization: Secondary | ICD-10-CM | POA: Diagnosis not present

## 2021-10-06 DIAGNOSIS — I10 Essential (primary) hypertension: Secondary | ICD-10-CM | POA: Diagnosis not present

## 2021-10-06 DIAGNOSIS — H35073 Retinal telangiectasis, bilateral: Secondary | ICD-10-CM

## 2021-10-06 DIAGNOSIS — H43813 Vitreous degeneration, bilateral: Secondary | ICD-10-CM

## 2021-10-06 DIAGNOSIS — H35033 Hypertensive retinopathy, bilateral: Secondary | ICD-10-CM | POA: Diagnosis not present

## 2021-11-24 ENCOUNTER — Other Ambulatory Visit: Payer: Self-pay

## 2021-11-24 ENCOUNTER — Encounter (INDEPENDENT_AMBULATORY_CARE_PROVIDER_SITE_OTHER): Payer: BC Managed Care – PPO | Admitting: Ophthalmology

## 2021-11-24 DIAGNOSIS — H35033 Hypertensive retinopathy, bilateral: Secondary | ICD-10-CM | POA: Diagnosis not present

## 2021-11-24 DIAGNOSIS — H43813 Vitreous degeneration, bilateral: Secondary | ICD-10-CM

## 2021-11-24 DIAGNOSIS — H35073 Retinal telangiectasis, bilateral: Secondary | ICD-10-CM | POA: Diagnosis not present

## 2021-11-24 DIAGNOSIS — H353231 Exudative age-related macular degeneration, bilateral, with active choroidal neovascularization: Secondary | ICD-10-CM

## 2021-11-24 DIAGNOSIS — I10 Essential (primary) hypertension: Secondary | ICD-10-CM | POA: Diagnosis not present

## 2022-01-12 ENCOUNTER — Encounter (INDEPENDENT_AMBULATORY_CARE_PROVIDER_SITE_OTHER): Payer: BC Managed Care – PPO | Admitting: Ophthalmology

## 2022-01-12 ENCOUNTER — Other Ambulatory Visit: Payer: Self-pay

## 2022-01-12 DIAGNOSIS — H43813 Vitreous degeneration, bilateral: Secondary | ICD-10-CM | POA: Diagnosis not present

## 2022-01-12 DIAGNOSIS — H35033 Hypertensive retinopathy, bilateral: Secondary | ICD-10-CM

## 2022-01-12 DIAGNOSIS — H35073 Retinal telangiectasis, bilateral: Secondary | ICD-10-CM | POA: Diagnosis not present

## 2022-01-12 DIAGNOSIS — H353231 Exudative age-related macular degeneration, bilateral, with active choroidal neovascularization: Secondary | ICD-10-CM | POA: Diagnosis not present

## 2022-01-12 DIAGNOSIS — I10 Essential (primary) hypertension: Secondary | ICD-10-CM

## 2022-01-12 DIAGNOSIS — H2513 Age-related nuclear cataract, bilateral: Secondary | ICD-10-CM

## 2022-03-09 ENCOUNTER — Encounter (INDEPENDENT_AMBULATORY_CARE_PROVIDER_SITE_OTHER): Payer: BC Managed Care – PPO | Admitting: Ophthalmology

## 2022-03-09 DIAGNOSIS — H43813 Vitreous degeneration, bilateral: Secondary | ICD-10-CM

## 2022-03-09 DIAGNOSIS — H35033 Hypertensive retinopathy, bilateral: Secondary | ICD-10-CM | POA: Diagnosis not present

## 2022-03-09 DIAGNOSIS — H353231 Exudative age-related macular degeneration, bilateral, with active choroidal neovascularization: Secondary | ICD-10-CM | POA: Diagnosis not present

## 2022-03-09 DIAGNOSIS — H35073 Retinal telangiectasis, bilateral: Secondary | ICD-10-CM

## 2022-03-09 DIAGNOSIS — I10 Essential (primary) hypertension: Secondary | ICD-10-CM | POA: Diagnosis not present

## 2022-05-04 ENCOUNTER — Encounter (INDEPENDENT_AMBULATORY_CARE_PROVIDER_SITE_OTHER): Payer: BC Managed Care – PPO | Admitting: Ophthalmology

## 2022-05-04 DIAGNOSIS — H353231 Exudative age-related macular degeneration, bilateral, with active choroidal neovascularization: Secondary | ICD-10-CM

## 2022-05-04 DIAGNOSIS — H35073 Retinal telangiectasis, bilateral: Secondary | ICD-10-CM

## 2022-05-04 DIAGNOSIS — H43813 Vitreous degeneration, bilateral: Secondary | ICD-10-CM

## 2022-05-04 DIAGNOSIS — H35033 Hypertensive retinopathy, bilateral: Secondary | ICD-10-CM

## 2022-05-04 DIAGNOSIS — I10 Essential (primary) hypertension: Secondary | ICD-10-CM | POA: Diagnosis not present

## 2022-06-29 ENCOUNTER — Encounter (INDEPENDENT_AMBULATORY_CARE_PROVIDER_SITE_OTHER): Payer: BC Managed Care – PPO | Admitting: Ophthalmology

## 2022-06-29 DIAGNOSIS — H35073 Retinal telangiectasis, bilateral: Secondary | ICD-10-CM | POA: Diagnosis not present

## 2022-06-29 DIAGNOSIS — H35033 Hypertensive retinopathy, bilateral: Secondary | ICD-10-CM

## 2022-06-29 DIAGNOSIS — I10 Essential (primary) hypertension: Secondary | ICD-10-CM | POA: Diagnosis not present

## 2022-06-29 DIAGNOSIS — H43813 Vitreous degeneration, bilateral: Secondary | ICD-10-CM

## 2022-06-29 DIAGNOSIS — H353231 Exudative age-related macular degeneration, bilateral, with active choroidal neovascularization: Secondary | ICD-10-CM | POA: Diagnosis not present

## 2022-08-31 ENCOUNTER — Encounter (INDEPENDENT_AMBULATORY_CARE_PROVIDER_SITE_OTHER): Payer: BC Managed Care – PPO | Admitting: Ophthalmology

## 2022-08-31 DIAGNOSIS — H35033 Hypertensive retinopathy, bilateral: Secondary | ICD-10-CM | POA: Diagnosis not present

## 2022-08-31 DIAGNOSIS — H35073 Retinal telangiectasis, bilateral: Secondary | ICD-10-CM

## 2022-08-31 DIAGNOSIS — H353231 Exudative age-related macular degeneration, bilateral, with active choroidal neovascularization: Secondary | ICD-10-CM | POA: Diagnosis not present

## 2022-08-31 DIAGNOSIS — I10 Essential (primary) hypertension: Secondary | ICD-10-CM

## 2022-08-31 DIAGNOSIS — H43813 Vitreous degeneration, bilateral: Secondary | ICD-10-CM

## 2022-11-09 ENCOUNTER — Encounter (INDEPENDENT_AMBULATORY_CARE_PROVIDER_SITE_OTHER): Payer: BC Managed Care – PPO | Admitting: Ophthalmology

## 2022-11-09 DIAGNOSIS — H35073 Retinal telangiectasis, bilateral: Secondary | ICD-10-CM

## 2022-11-09 DIAGNOSIS — H353231 Exudative age-related macular degeneration, bilateral, with active choroidal neovascularization: Secondary | ICD-10-CM | POA: Diagnosis not present

## 2022-11-09 DIAGNOSIS — I10 Essential (primary) hypertension: Secondary | ICD-10-CM | POA: Diagnosis not present

## 2022-11-09 DIAGNOSIS — H35033 Hypertensive retinopathy, bilateral: Secondary | ICD-10-CM | POA: Diagnosis not present

## 2022-11-09 DIAGNOSIS — H43813 Vitreous degeneration, bilateral: Secondary | ICD-10-CM

## 2023-01-18 ENCOUNTER — Encounter (INDEPENDENT_AMBULATORY_CARE_PROVIDER_SITE_OTHER): Payer: BC Managed Care – PPO | Admitting: Ophthalmology

## 2023-01-18 DIAGNOSIS — H35033 Hypertensive retinopathy, bilateral: Secondary | ICD-10-CM

## 2023-01-18 DIAGNOSIS — H353231 Exudative age-related macular degeneration, bilateral, with active choroidal neovascularization: Secondary | ICD-10-CM | POA: Diagnosis not present

## 2023-01-18 DIAGNOSIS — H43813 Vitreous degeneration, bilateral: Secondary | ICD-10-CM

## 2023-01-18 DIAGNOSIS — I10 Essential (primary) hypertension: Secondary | ICD-10-CM

## 2023-01-18 DIAGNOSIS — H35073 Retinal telangiectasis, bilateral: Secondary | ICD-10-CM

## 2023-03-28 ENCOUNTER — Other Ambulatory Visit: Payer: Self-pay | Admitting: Family Medicine

## 2023-03-28 DIAGNOSIS — Z1231 Encounter for screening mammogram for malignant neoplasm of breast: Secondary | ICD-10-CM

## 2023-03-30 ENCOUNTER — Ambulatory Visit
Admission: RE | Admit: 2023-03-30 | Discharge: 2023-03-30 | Disposition: A | Discharge status: Home / Self Care | Payer: BC Managed Care – PPO | Source: Ambulatory Visit | Attending: Family Medicine | Admitting: Family Medicine

## 2023-03-30 DIAGNOSIS — Z1231 Encounter for screening mammogram for malignant neoplasm of breast: Secondary | ICD-10-CM | POA: Insufficient documentation

## 2023-04-12 ENCOUNTER — Encounter (INDEPENDENT_AMBULATORY_CARE_PROVIDER_SITE_OTHER): Payer: BC Managed Care – PPO | Admitting: Ophthalmology

## 2023-04-12 DIAGNOSIS — H353231 Exudative age-related macular degeneration, bilateral, with active choroidal neovascularization: Secondary | ICD-10-CM | POA: Diagnosis not present

## 2023-04-12 DIAGNOSIS — H43813 Vitreous degeneration, bilateral: Secondary | ICD-10-CM

## 2023-04-12 DIAGNOSIS — H35033 Hypertensive retinopathy, bilateral: Secondary | ICD-10-CM

## 2023-04-12 DIAGNOSIS — H2513 Age-related nuclear cataract, bilateral: Secondary | ICD-10-CM

## 2023-04-12 DIAGNOSIS — I10 Essential (primary) hypertension: Secondary | ICD-10-CM

## 2023-04-12 DIAGNOSIS — H35073 Retinal telangiectasis, bilateral: Secondary | ICD-10-CM | POA: Diagnosis not present

## 2023-07-05 ENCOUNTER — Encounter (INDEPENDENT_AMBULATORY_CARE_PROVIDER_SITE_OTHER): Payer: BC Managed Care – PPO | Admitting: Ophthalmology

## 2023-07-05 DIAGNOSIS — I10 Essential (primary) hypertension: Secondary | ICD-10-CM

## 2023-07-05 DIAGNOSIS — H43813 Vitreous degeneration, bilateral: Secondary | ICD-10-CM

## 2023-07-05 DIAGNOSIS — H35073 Retinal telangiectasis, bilateral: Secondary | ICD-10-CM

## 2023-07-05 DIAGNOSIS — H353231 Exudative age-related macular degeneration, bilateral, with active choroidal neovascularization: Secondary | ICD-10-CM | POA: Diagnosis not present

## 2023-07-05 DIAGNOSIS — H35033 Hypertensive retinopathy, bilateral: Secondary | ICD-10-CM | POA: Diagnosis not present

## 2023-09-27 ENCOUNTER — Encounter (INDEPENDENT_AMBULATORY_CARE_PROVIDER_SITE_OTHER): Payer: BC Managed Care – PPO | Admitting: Ophthalmology

## 2023-10-05 ENCOUNTER — Encounter (INDEPENDENT_AMBULATORY_CARE_PROVIDER_SITE_OTHER): Payer: BC Managed Care – PPO | Admitting: Ophthalmology

## 2023-10-26 ENCOUNTER — Encounter (INDEPENDENT_AMBULATORY_CARE_PROVIDER_SITE_OTHER): Payer: BC Managed Care – PPO | Admitting: Ophthalmology

## 2023-10-26 DIAGNOSIS — H353231 Exudative age-related macular degeneration, bilateral, with active choroidal neovascularization: Secondary | ICD-10-CM

## 2023-10-26 DIAGNOSIS — H43813 Vitreous degeneration, bilateral: Secondary | ICD-10-CM

## 2023-10-26 DIAGNOSIS — H35073 Retinal telangiectasis, bilateral: Secondary | ICD-10-CM

## 2024-02-07 ENCOUNTER — Encounter (INDEPENDENT_AMBULATORY_CARE_PROVIDER_SITE_OTHER): Admitting: Ophthalmology

## 2024-02-07 DIAGNOSIS — H353231 Exudative age-related macular degeneration, bilateral, with active choroidal neovascularization: Secondary | ICD-10-CM

## 2024-02-07 DIAGNOSIS — H35073 Retinal telangiectasis, bilateral: Secondary | ICD-10-CM | POA: Diagnosis not present

## 2024-02-07 DIAGNOSIS — H2513 Age-related nuclear cataract, bilateral: Secondary | ICD-10-CM

## 2024-02-07 DIAGNOSIS — H35033 Hypertensive retinopathy, bilateral: Secondary | ICD-10-CM | POA: Diagnosis not present

## 2024-02-07 DIAGNOSIS — I10 Essential (primary) hypertension: Secondary | ICD-10-CM

## 2024-02-07 DIAGNOSIS — H43813 Vitreous degeneration, bilateral: Secondary | ICD-10-CM

## 2024-02-08 ENCOUNTER — Encounter (INDEPENDENT_AMBULATORY_CARE_PROVIDER_SITE_OTHER): Payer: BC Managed Care – PPO | Admitting: Ophthalmology

## 2024-05-01 ENCOUNTER — Other Ambulatory Visit: Payer: Self-pay | Admitting: Family Medicine

## 2024-05-01 DIAGNOSIS — Z1231 Encounter for screening mammogram for malignant neoplasm of breast: Secondary | ICD-10-CM

## 2024-05-16 ENCOUNTER — Ambulatory Visit
Admission: RE | Admit: 2024-05-16 | Discharge: 2024-05-16 | Disposition: A | Discharge status: Home / Self Care | Source: Ambulatory Visit | Attending: Family Medicine | Admitting: Family Medicine

## 2024-05-16 DIAGNOSIS — Z1231 Encounter for screening mammogram for malignant neoplasm of breast: Secondary | ICD-10-CM | POA: Insufficient documentation

## 2024-05-22 ENCOUNTER — Encounter (INDEPENDENT_AMBULATORY_CARE_PROVIDER_SITE_OTHER): Admitting: Ophthalmology

## 2024-05-22 DIAGNOSIS — H35073 Retinal telangiectasis, bilateral: Secondary | ICD-10-CM | POA: Diagnosis not present

## 2024-05-22 DIAGNOSIS — H353231 Exudative age-related macular degeneration, bilateral, with active choroidal neovascularization: Secondary | ICD-10-CM

## 2024-05-22 DIAGNOSIS — H2513 Age-related nuclear cataract, bilateral: Secondary | ICD-10-CM

## 2024-05-22 DIAGNOSIS — H35033 Hypertensive retinopathy, bilateral: Secondary | ICD-10-CM | POA: Diagnosis not present

## 2024-05-22 DIAGNOSIS — I10 Essential (primary) hypertension: Secondary | ICD-10-CM | POA: Diagnosis not present

## 2024-05-22 DIAGNOSIS — H43813 Vitreous degeneration, bilateral: Secondary | ICD-10-CM

## 2024-09-04 ENCOUNTER — Encounter (INDEPENDENT_AMBULATORY_CARE_PROVIDER_SITE_OTHER): Admitting: Ophthalmology

## 2024-09-26 ENCOUNTER — Encounter (INDEPENDENT_AMBULATORY_CARE_PROVIDER_SITE_OTHER): Admitting: Ophthalmology

## 2024-09-26 DIAGNOSIS — H35073 Retinal telangiectasis, bilateral: Secondary | ICD-10-CM

## 2024-09-26 DIAGNOSIS — H35033 Hypertensive retinopathy, bilateral: Secondary | ICD-10-CM

## 2024-09-26 DIAGNOSIS — I10 Essential (primary) hypertension: Secondary | ICD-10-CM

## 2024-09-26 DIAGNOSIS — H353231 Exudative age-related macular degeneration, bilateral, with active choroidal neovascularization: Secondary | ICD-10-CM

## 2024-09-26 DIAGNOSIS — H43813 Vitreous degeneration, bilateral: Secondary | ICD-10-CM | POA: Diagnosis not present

## 2024-09-26 DIAGNOSIS — H2513 Age-related nuclear cataract, bilateral: Secondary | ICD-10-CM

## 2025-01-08 ENCOUNTER — Encounter (INDEPENDENT_AMBULATORY_CARE_PROVIDER_SITE_OTHER): Admitting: Ophthalmology
